# Patient Record
Sex: Female | Born: 1995 | Race: Black or African American | Hispanic: No | Marital: Single | Smoking: Current every day smoker
Health system: Southern US, Community
[De-identification: ages and names within clinical notes are randomized; demographics above are authoritative.]

## PROBLEM LIST (undated history)

## (undated) DIAGNOSIS — J45909 Unspecified asthma, uncomplicated: Secondary | ICD-10-CM

## (undated) DIAGNOSIS — T7840XA Allergy, unspecified, initial encounter: Secondary | ICD-10-CM

## (undated) HISTORY — PX: KNEE SURGERY: SHX244

---

## 2014-04-13 ENCOUNTER — Encounter (HOSPITAL_COMMUNITY): Payer: Self-pay | Admitting: Emergency Medicine

## 2014-04-13 ENCOUNTER — Emergency Department (HOSPITAL_COMMUNITY)
Admission: EM | Admit: 2014-04-13 | Discharge: 2014-04-13 | Disposition: A | Discharge status: Home / Self Care | Attending: Emergency Medicine | Admitting: Emergency Medicine

## 2014-04-13 ENCOUNTER — Emergency Department (HOSPITAL_COMMUNITY)

## 2014-04-13 DIAGNOSIS — R079 Chest pain, unspecified: Secondary | ICD-10-CM | POA: Insufficient documentation

## 2014-04-13 DIAGNOSIS — N390 Urinary tract infection, site not specified: Secondary | ICD-10-CM | POA: Diagnosis not present

## 2014-04-13 DIAGNOSIS — Z3202 Encounter for pregnancy test, result negative: Secondary | ICD-10-CM | POA: Insufficient documentation

## 2014-04-13 DIAGNOSIS — R51 Headache: Secondary | ICD-10-CM | POA: Insufficient documentation

## 2014-04-13 DIAGNOSIS — J45901 Unspecified asthma with (acute) exacerbation: Secondary | ICD-10-CM | POA: Insufficient documentation

## 2014-04-13 DIAGNOSIS — J4521 Mild intermittent asthma with (acute) exacerbation: Secondary | ICD-10-CM

## 2014-04-13 HISTORY — DX: Allergy, unspecified, initial encounter: T78.40XA

## 2014-04-13 LAB — CBC WITH DIFFERENTIAL/PLATELET
BASOS ABS: 0 10*3/uL (ref 0.0–0.1)
BASOS PCT: 0 % (ref 0–1)
Eosinophils Absolute: 0.3 10*3/uL (ref 0.0–0.7)
Eosinophils Relative: 3 % (ref 0–5)
HEMATOCRIT: 40.3 % (ref 36.0–46.0)
HEMOGLOBIN: 13.8 g/dL (ref 12.0–15.0)
LYMPHS ABS: 2.8 10*3/uL (ref 0.7–4.0)
Lymphocytes Relative: 33 % (ref 12–46)
MCH: 23.9 pg — ABNORMAL LOW (ref 26.0–34.0)
MCHC: 34.2 g/dL (ref 30.0–36.0)
MCV: 69.7 fL — AB (ref 78.0–100.0)
Monocytes Absolute: 0.5 10*3/uL (ref 0.1–1.0)
Monocytes Relative: 6 % (ref 3–12)
Neutro Abs: 5 10*3/uL (ref 1.7–7.7)
Neutrophils Relative %: 58 % (ref 43–77)
Platelets: 379 10*3/uL (ref 150–400)
RBC: 5.78 MIL/uL — ABNORMAL HIGH (ref 3.87–5.11)
RDW: 13.2 % (ref 11.5–15.5)
WBC: 8.6 10*3/uL (ref 4.0–10.5)

## 2014-04-13 LAB — URINALYSIS, ROUTINE W REFLEX MICROSCOPIC
BILIRUBIN URINE: NEGATIVE
GLUCOSE, UA: NEGATIVE mg/dL
Hgb urine dipstick: NEGATIVE
Ketones, ur: NEGATIVE mg/dL
Nitrite: POSITIVE — AB
Protein, ur: NEGATIVE mg/dL
SPECIFIC GRAVITY, URINE: 1.024 (ref 1.005–1.030)
UROBILINOGEN UA: 1 mg/dL (ref 0.0–1.0)
pH: 6 (ref 5.0–8.0)

## 2014-04-13 LAB — I-STAT CHEM 8, ED
BUN: 6 mg/dL (ref 6–23)
CALCIUM ION: 1.22 mmol/L (ref 1.12–1.23)
Chloride: 102 mEq/L (ref 96–112)
Creatinine, Ser: 0.8 mg/dL (ref 0.50–1.10)
Glucose, Bld: 108 mg/dL — ABNORMAL HIGH (ref 70–99)
HEMATOCRIT: 47 % — AB (ref 36.0–46.0)
Hemoglobin: 16 g/dL — ABNORMAL HIGH (ref 12.0–15.0)
Potassium: 3.5 mEq/L — ABNORMAL LOW (ref 3.7–5.3)
Sodium: 140 mEq/L (ref 137–147)
TCO2: 22 mmol/L (ref 0–100)

## 2014-04-13 LAB — D-DIMER, QUANTITATIVE: D-Dimer, Quant: <0.27 ug/mL-FEU (ref 0.00–0.48)

## 2014-04-13 LAB — I-STAT TROPONIN, ED: Troponin i, poc: 0 ng/mL (ref 0.00–0.08)

## 2014-04-13 LAB — URINE MICROSCOPIC-ADD ON

## 2014-04-13 LAB — POC URINE PREG, ED: PREG TEST UR: NEGATIVE

## 2014-04-13 MED ORDER — SODIUM CHLORIDE 0.9 % IV BOLUS (SEPSIS)
1000.0000 mL | Freq: Once | INTRAVENOUS | Status: AC
  Administered 2014-04-13: 1000 mL via INTRAVENOUS

## 2014-04-13 MED ORDER — NITROFURANTOIN MONOHYD MACRO 100 MG PO CAPS
100.0000 mg | ORAL_CAPSULE | Freq: Once | ORAL | Status: AC
  Administered 2014-04-13: 100 mg via ORAL
  Filled 2014-04-13: qty 1

## 2014-04-13 MED ORDER — ALBUTEROL SULFATE HFA 108 (90 BASE) MCG/ACT IN AERS
2.0000 | INHALATION_SPRAY | RESPIRATORY_TRACT | Status: DC | PRN
  Administered 2014-04-13: 2 via RESPIRATORY_TRACT
  Filled 2014-04-13: qty 6.7

## 2014-04-13 MED ORDER — NITROFURANTOIN MONOHYD MACRO 100 MG PO CAPS: 100.0000 mg | ORAL_CAPSULE | Freq: Two times a day (BID) | ORAL | Status: DC

## 2014-04-13 MED ORDER — PREDNISONE 20 MG PO TABS: ORAL_TABLET | ORAL | Status: DC

## 2014-04-13 MED ORDER — ALBUTEROL SULFATE (2.5 MG/3ML) 0.083% IN NEBU
5.0000 mg | INHALATION_SOLUTION | Freq: Once | RESPIRATORY_TRACT | Status: AC
  Administered 2014-04-13: 5 mg via RESPIRATORY_TRACT
  Filled 2014-04-13: qty 6

## 2014-04-13 MED ORDER — PREDNISONE 20 MG PO TABS
60.0000 mg | ORAL_TABLET | Freq: Once | ORAL | Status: AC
  Administered 2014-04-13: 60 mg via ORAL
  Filled 2014-04-13: qty 3

## 2014-04-13 MED ORDER — IPRATROPIUM BROMIDE 0.02 % IN SOLN
0.5000 mg | Freq: Once | RESPIRATORY_TRACT | Status: AC
  Administered 2014-04-13: 0.5 mg via RESPIRATORY_TRACT
  Filled 2014-04-13: qty 2.5

## 2014-04-13 NOTE — Discharge Instructions (Signed)
Bronchospasm °A bronchospasm is a spasm or tightening of the airways going into the lungs. During a bronchospasm breathing becomes more difficult because the airways get smaller. When this happens there can be coughing, a whistling sound when breathing (wheezing), and difficulty breathing. Bronchospasm is often associated with asthma, but not all patients who experience a bronchospasm have asthma. °CAUSES  °A bronchospasm is caused by inflammation or irritation of the airways. The inflammation or irritation may be triggered by:  °· Allergies (such as to animals, pollen, food, or mold). Allergens that cause bronchospasm may cause wheezing immediately after exposure or many hours later.   °· Infection. Viral infections are believed to be the most common cause of bronchospasm.   °· Exercise.   °· Irritants (such as pollution, cigarette smoke, strong odors, aerosol sprays, and paint fumes).   °· Weather changes. Winds increase molds and pollens in the air. Rain refreshes the air by washing irritants out. Cold air may cause inflammation.   °· Stress and emotional upset.   °SIGNS AND SYMPTOMS  °· Wheezing.   °· Excessive nighttime coughing.   °· Frequent or severe coughing with a simple cold.   °· Chest tightness.   °· Shortness of breath.   °DIAGNOSIS  °Bronchospasm is usually diagnosed through a history and physical exam. Tests, such as chest X-rays, are sometimes done to look for other conditions. °TREATMENT  °· Inhaled medicines can be given to open up your airways and help you breathe. The medicines can be given using either an inhaler or a nebulizer machine. °· Corticosteroid medicines may be given for severe bronchospasm, usually when it is associated with asthma. °HOME CARE INSTRUCTIONS  °· Always have a plan prepared for seeking medical care. Know when to call your health care provider and local emergency services (911 in the U.S.). Know where you can access local emergency care. °· Only take medicines as  directed by your health care provider. °· If you were prescribed an inhaler or nebulizer machine, ask your health care provider to explain how to use it correctly. Always use a spacer with your inhaler if you were given one. °· It is necessary to remain calm during an attack. Try to relax and breathe more slowly.  °· Control your home environment in the following ways:   °¨ Change your heating and air conditioning filter at least once a month.   °¨ Limit your use of fireplaces and wood stoves. °¨ Do not smoke and do not allow smoking in your home.   °¨ Avoid exposure to perfumes and fragrances.   °¨ Get rid of pests (such as roaches and mice) and their droppings.   °¨ Throw away plants if you see mold on them.   °¨ Keep your house clean and dust free.   °¨ Replace carpet with wood, tile, or vinyl flooring. Carpet can trap dander and dust.   °¨ Use allergy-proof pillows, mattress covers, and box spring covers.   °¨ Wash bed sheets and blankets every week in hot water and dry them in a dryer.   °¨ Use blankets that are made of polyester or cotton.   °¨ Wash hands frequently. °SEEK MEDICAL CARE IF:  °· You have muscle aches.   °· You have chest pain.   °· The sputum changes from clear or white to yellow, green, gray, or bloody.   °· The sputum you cough up gets thicker.   °· There are problems that may be related to the medicine you are given, such as a rash, itching, swelling, or trouble breathing.   °SEEK IMMEDIATE MEDICAL CARE IF:  °· You have worsening wheezing and coughing even   after taking your prescribed medicines.   You have increased difficulty breathing.   You develop severe chest pain. MAKE SURE YOU:   Understand these instructions.  Will watch your condition.  Will get help right away if you are not doing well or get worse. Document Released: 08/13/2003 Document Revised: 08/15/2013 Document Reviewed: 01/30/2013 [facility] Patient Information 2015 [place], [place]. This information is not  intended to replace advice given to you by your health care provider. Make sure you discuss any questions you have with your health care provider.  Urinary Tract Infection A urinary tract infection (UTI) can occur any place along the urinary tract. The tract includes the kidneys, ureters, bladder, and urethra. A type of germ called bacteria often causes a UTI. UTIs are often helped with antibiotic medicine.  HOME CARE   If given, take antibiotics as told by your doctor. Finish them even if you start to feel better.  Drink enough fluids to keep your pee (urine) clear or pale yellow.  Avoid tea, drinks with caffeine, and bubbly (carbonated) drinks.  Pee often. Avoid holding your pee in for a long time.  Pee before and after having sex (intercourse).  Wipe from front to back after you poop (bowel movement) if you are a woman. Use each tissue only once. GET HELP RIGHT AWAY IF:   You have back pain.  You have lower belly (abdominal) pain.  You have chills.  You feel sick to your stomach (nauseous).  You throw up (vomit).  Your burning or discomfort with peeing does not go away.  You have a fever.  Your symptoms are not better in 3 days. MAKE SURE YOU:   Understand these instructions.  Will watch your condition.  Will get help right away if you are not doing well or get worse. Document Released: 01/27/2008 Document Revised: 05/04/2012 Document Reviewed: 03/10/2012 [facility] Patient Information 2015 [place], [place]. This information is not intended to replace advice given to you by your health care provider. Make sure you discuss any questions you have with your health care provider.   Emergency Department Resource Guide 1) Find a Doctor and Pay Out of Pocket Although you won't have to find out who is covered by your insurance plan, it is a good idea to ask around and get recommendations. You will then need to call the office and see if the doctor you have chosen will accept  you as a new patient and what types of options they offer for patients who are self-pay. Some doctors offer discounts or will set up payment plans for their patients who do not have insurance, but you will need to ask so you aren't surprised when you get to your appointment.  2) Contact Your Local Health Department Not all health departments have doctors that can see patients for sick visits, but many do, so it is worth a call to see if yours does. If you don't know where your local health department is, you can check in your phone book. The CDC also has a tool to help you locate your state's health department, and many state websites also have listings of all of their local health departments.  3) Find a Walk-in Clinic If your illness is not likely to be very severe or complicated, you may want to try a walk in clinic. These are popping up all over the country in pharmacies, drugstores, and shopping centers. They're usually staffed by nurse practitioners or physician assistants that have been trained to treat common  illnesses and complaints. They're usually fairly quick and inexpensive. However, if you have serious medical issues or chronic medical problems, these are probably not your best option.  No Primary Care Doctor: - Call Health Connect at  [phone] - they can help you locate a primary care doctor that  accepts your insurance, provides certain services, etc. - Physician Referral Service- [phone]  Chronic Pain Problems: Organization         Address  Phone   Notes  [facility]  [phone] Patients need to be referred by their primary care doctor.   Medication Assistance: Organization         Address  Phone   Notes  [facility] Medication [facility] [address] [place] [place], [place] [address] [phone] --Must be a resident of [facility] -- Must have NO insurance coverage whatsoever (no Medicaid/ Medicare, etc.) -- The pt. MUST  have a primary care doctor that directs their care regularly and follows them in the community   [facility]ist  [phone]   [employer]  [phone]    Agencies that provide inexpensive medical care: Organization         Address  Phone   Notes  [facility]  [phone]   [facility]    [phone]   [facility] [address] [place], [place] [address] [phone]   [facility] [address] [place] [place] [phone]   Planned Parenthood    [phone]   [facility]    [phone]   Community [facility] and [facility]  [address], [place] Phone:  [phone], Fax:  [phone] Hours of Operation:  9 am - 6 pm, M-F.  Also accepts Medicaid/Medicare and self-pay.  [facility]  [address], [place] Phone: [phone], Fax: [phone]. Hours of Operation:  8:30 am - 5:30 pm, M-F.  Also accepts Medicaid and self-pay.  [facility] [place] [address], [place] Phone: [phone]   [facility] [address] Natasha Bence [place], [place] [phone], Ext. [phone] Mondays & Thursdays: 7-9 AM.  First 15 patients are seen on a first come, first serve basis.    Medicaid-accepting [facility] Providers:  Organization         Address  Phone   Notes  [facility] [address],  [phone] Also accepts self-pay patients.  [facility] [address] Laurell Josephs [place], [place]  [phone]   [facility] [address], [place] [phone]   [facility] [address] [place] [phone]   Renaye Rakers [address], [place]   [phone] Only accepts [place] patients after they have their name applied to their card.   Self-Pay (no insurance) in [facility]:  Organization         Address  Phone   Notes  Sickle Cell Patients, [facility] [address] [place], [place] [phone]   [facility] [address] [place], [place] [phone]   [facility] [place]  [address], [place] [phone]   [facility]/Dr. Osei-Bonsu  [address], [place] or [address], Laurell Josephs [address],  [place] [phone] Phone number for both [facility] and [place] locations is the same.  [facility]gent [facility] and [facility] [address], [place] [phone]   [facility]  [address] [place] [address], [place] or [address] [phone]   [facility] [address], [place] [phone]) [phone], phone; [phone], fax Sees patients 1st and 3rd Saturday of every month.  Must not qualify for public or private insurance (i.e. Medicaid, Medicare, [place] Health Choice, Veterans' Benefits)  Household income should be no more than 200% of the poverty level The clinic cannot treat you if you are pregnant or think you are pregnant  Sexually transmitted diseases are not treated at the clinic.    Dental Care: Organization         Address  Phone  Notes  [facility] [address], [place] [phone] Accepts children up to age 20 who are enrolled in [place]icaid or [place] Health Choice; pregnant women with a Medicaid card; and children who have applied for Medicaid or [place] Health Choice, but were declined, whose parents can pay a reduced fee at time of service.  [facility] [place]  [address], [place] [phone]) [phone] Accepts children up to age 20 who are enrolled in [place] or [place] Health Choice; pregnant women with a Medicaid card; and children who have applied for Medicaid or [place] Health Choice, but were declined, whose parents can  pay a reduced fee at time of service.  [facility]  [address], [place] [phone] Patients are seen by appointment only. Walk-ins are not accepted. [facility] will see patients 20 years of age and older. Monday - Tuesday (8am-5pm) Most Wednesdays (8:30-5pm) $30 per visit, cash only  [facility]  [address], [facility] [phone] Patients are seen by appointment only. Walk-ins are not accepted. [facility] will see patients 20 years of age and older. One Wednesday Evening (Monthly: Volunteer Based).  $30 per visit, cash only  [employer] [facility]  [phone] for adults; Children under age 474, call Graduate Pediatric Dentistry at [phone]. Children aged 894-14, please call [phone] to request a pediatric application.  Dental services are provided in all areas of dental care including fillings, crowns and bridges, complete and partial dentures, implants, gum treatment, root canals, and extractions. Preventive care is also provided. Treatment is provided to both adults and children. Patients are selected via a lottery and there is often a waiting list.   [facility] [address], [place]  [phone] [email or web address]   [facility] [address], [place], [place] [phone], Ext. [phone] Second and Fourth Thursday of each month, opens at 6:30 AM; Clinic ends at 9 AM.  Patients are seen on a first-come first-served basis, and a limited number are seen during each clinic.   [facility]  [address], [place], [place] [phone]) [phone]   Eligibility Requirements You must have lived in [place], [place], or [place] for at least the last three months.   You cannot be eligible for state or federal sponsored National Cityhealthcare insurance, including CIGNAVeterans Administration, [place]icaid, or [employer]Medicare.   You generally cannot be eligible for healthcare  insurance through your employer.    How to apply: Eligibility screenings are held every Tuesday and  Wednesday afternoon from 1:00 pm until 4:00 pm. You do not need an appointment for the interview!  [facility] [address], [place], [place] [phone]   [facility]  [phone]   [facility]  [phone]   [facility]  [phone]    Behavioral Health Resources in the Community: Intensive Outpatient Programs Organization         Address  Phone  Notes  [facility] [address], [place], [place] [phone]   [facility] [address], [place], [place] [phone]   [facility] [address], [place], [place]  [phone]   [facility] [place] [facility] [address],  [place], [place] [phone] or [phone]   Substance Abuse Resources Organization         Address  Phone  Notes  [facility]  [phone]   [facility]  [phone]   [facility]  [phone]   [facility]  [phone]   Residential & Outpatient Substance Abuse Program  [phone]   Psychological Services Organization         Address  Phone  Notes  [facility]  [phone]- [phone]   [facility]  [phone]- [phone]   [facility] [address], [place] [phone] or [phone]    Mobile Crisis Teams Organization         Address  Phone  Notes  [facility], Mobile Crisis Care Unit  [phone]   [facility]  [address] [place], [place] [phone]   Doristine LocksSharon DeEsch [address] [place] [phone]    Self-Help/Support Groups Organization         Address  Phone             Notes  [facility] [place] - variety of support groups  336- [phone] Call for more information  Narcotics Anonymous (NA),  [facility] [address], [place]  2 meetings at this location   Statisticianesidential Treatment Programs Organization         Address  Phone  Notes  [facility] [address],    [place]  [phone]   [facility]  [address], [place] [phone], [place], [place] [phone]   [facility] [address], [place] [phone] Admissions: 8am-3pm M-F  [facility] [address]    [place], [place] [phone]   [facility] [address], [place], [place] [phone]   [facility] [address]  [place], [place] [phone]   [facility] - Intensive Outpatient [address], [place], [place] [phone]   [facility] ([facility] Recovery [facility] [address]  [place], [place] [phone] or [phone]   [facility] ([facility]) [address] [place], [place] [phone] Accepts Medicaid  [facility] [address]  [place] [phone] Substance Abuse/Addiction Treatment   [facility] Resources Organization         Address  Phone  Notes  [facility]  [phone]   Angie FavaJulie Brannon, PhD [address], [address] [place], [place]   [phone] or [phone](336) [phone]   [facility]   [address] [place], [place] [phone](336) [phone]   [facility] [address], [place], [place] [phone]) [phone] Insurance/Medicaid/sponsorship through [facility]  [facility]  [facility]amilies [address]                                    [place], [place] [phone] Therapy/tele-psych/case  [facility] [address]   [place], [place] [phone]    Dr. Lolly Mustache  [phone]   [facility]  [employer] [facility] Dept. 1) [address], [place] 2) [address] [place] 3)  [address], [place] [phone]  [phone]     [facility] Child Abuse Hotline 910-128-1903 or [phone] (After Hours)

## 2014-04-13 NOTE — ED Notes (Signed)
Pt ambulated around ED. Pulse Ox 100%. HR 130. Pt denies distress. Pt tolerated well.  PA aware.

## 2014-04-13 NOTE — ED Notes (Signed)
Per pt, has had chest pain starting this morning. Pain in center of chest and sharp. Has been short of breath for 2 days.  No hx of asthma diagnosed but has had problems in the past.   .

## 2014-04-13 NOTE — ED Notes (Signed)
PA Tran at bedside. 

## 2014-04-13 NOTE — ED Provider Notes (Signed)
Medical screening examination/treatment/procedure(s) were performed by non-physician practitioner and as supervising physician I was immediately available for consultation/collaboration.   EKG Interpretation   Date/Time:  Friday April 13 2014 17:10:38 EDT Ventricular Rate:  131 PR Interval:  158 QRS Duration: 57 QT Interval:  267 QTC Calculation: 394 R Axis:   87 Text Interpretation:  Sinus tachycardia Consider right atrial enlargement  Nonspecific T abnormalities, diffuse leads Artifact in lead(s) V4 V5 No  previous ECGs available Confirmed by Ronon Ferger  MD, Christeen Lai ([id number]) on 04/13/2014  5:30:46 PM        Richardean Canalavid H Genora Arp, MD 04/13/14 2340

## 2014-04-13 NOTE — ED Provider Notes (Signed)
CSN: [id number]     Arrival date & time 04/13/14  1701 History  This chart was scribed for Carol Helper, PA, working with Richardean Canal, MD found by Elon Spanner, ED Scribe. This patient was seen in room WTR5/WTR5 and the patient's care was started at 5:13 PM.   No chief complaint on file.  The history is provided by the patient. No language interpreter was used.    HPI Comments: Carol Ramos is a 18 y.o. female who presents to the Emergency Department complaining of gradually worsening SOB onset 1 week ago with worsening today.  Patient states she has a history of undiagnosed asthma and similar episodes of SOB in the past.  Patient also complains of associated CP onset today, which she states is atypical of her episodes of SOB. She reports one episode of hemoptysis today as well as current nausea, rhinorrhea, and headache.   She states her chest pain is aggravated by a deep breath.  Patient states she flew on an airplane on 8/14.  Patient denies fever, chills, sore throat, vomiting, diarrhea, DVT's, recent surgery, birth control use, pregnancy.  No past medical history on file. No past surgical history on file. No family history on file. History  Substance Use Topics  . Smoking status: Not on file  . Smokeless tobacco: Not on file  . Alcohol Use: Not on file   OB History   No data available     Review of Systems  Constitutional: Negative for fever and chills.  HENT: Positive for rhinorrhea. Negative for sore throat.   Respiratory: Positive for cough and shortness of breath.   Gastrointestinal: Positive for nausea. Negative for vomiting.  Neurological: Positive for headaches.      Allergies  Review of patient's allergies indicates not on file.  Home Medications   Prior to Admission medications   Not on File   There were no vitals taken for this visit. Physical Exam  Nursing note and vitals reviewed. Constitutional: She is oriented to person, place, and time. She appears  well-developed and well-nourished. No distress.  HENT:  Head: Normocephalic and atraumatic.  ENT normal   Eyes: Conjunctivae and EOM are normal.  Neck: Neck supple. No tracheal deviation present.  Cardiovascular:  Tachycardic.    Pulmonary/Chest: She has wheezes. She has no rales. She exhibits tenderness.  Decreased breath sounds with expiratory wheezes throughout.  No rales.  Anterior chest wall tenderness to palpation.  No crepitus or erythema.    Musculoskeletal: Normal range of motion.  Neurological: She is alert and oriented to person, place, and time.  Skin: Skin is warm and dry.  Psychiatric: She has a normal mood and affect. Her behavior is normal.    ED Course  Procedures (including critical care time)  DIAGNOSTIC STUDIES: Oxygen Saturation is 100% on RA, normal by my interpretation.    COORDINATION OF CARE:  5:21 PM Likely asthma exacerbation.  However pt has some risk factors for PE including hemoptysis, recent plane travel from [place] to [facility].  Discussed plan to order breathing treatment and D-dimer.  Patient acknowledges and agrees with plan.  Care discussed with Dr. Silverio Lay.   6:28 PM Pt also endorse urinary urgency/frequency x 1-2 weeks.  NO abd or back pain.  Will check UA.  7:52 PM CXR without acute infiltrates concerning for PNA.  Pt felt much better after breathing treatment, lungs clear on reexamination, ambulate with 100% on RA.  D-dimer negative, low suspicion for PE.  Tachycardia improves with  IVF.  No fever.  Pt nontoxic in appearance.  UA with evidence of UTI.  macrobid provided.  Pt will be discharge with rescue inhaler, steroid, and abx.  Resources provided.  All questions answered to pts satisfaction.    Labs Review Labs Reviewed  CBC WITH DIFFERENTIAL - Abnormal; Notable for the following:    RBC 5.78 (*)    MCV 69.7 (*)    MCH 23.9 (*)    All other components within normal limits  URINALYSIS, ROUTINE W REFLEX MICROSCOPIC - Abnormal; Notable for the  following:    Color, Urine AMBER (*)    APPearance CLOUDY (*)    Nitrite POSITIVE (*)    Leukocytes, UA TRACE (*)    All other components within normal limits  URINE MICROSCOPIC-ADD ON - Abnormal; Notable for the following:    Bacteria, UA MANY (*)    All other components within normal limits  I-STAT CHEM 8, ED - Abnormal; Notable for the following:    Potassium 3.5 (*)    Glucose, Bld 108 (*)    Hemoglobin 16.0 (*)    HCT 47.0 (*)    All other components within normal limits  D-DIMER, QUANTITATIVE  POC URINE PREG, ED  Rosezena SensorI-STAT TROPOININ, ED    Imaging Review Dg Chest 2 View  04/13/2014   CLINICAL DATA:  Shortness of breath, wheezing  EXAM: CHEST  2 VIEW  COMPARISON:  None.  FINDINGS: Cardiomediastinal silhouette is unremarkable. No acute infiltrate or pleural effusion. No pulmonary edema. Central mild bronchitic changes. Bony thorax is unremarkable.  IMPRESSION: No acute infiltrate or pulmonary edema. Central mild bronchitic changes.   Electronically Signed   By: Natasha MeadLiviu  Pop M.D.   On: 04/13/2014 17:42     EKG Interpretation   Date/Time:  Friday April 13 2014 17:10:38 EDT Ventricular Rate:  131 PR Interval:  158 QRS Duration: 57 QT Interval:  267 QTC Calculation: 394 R Axis:   87 Text Interpretation:  Sinus tachycardia Consider right atrial enlargement  Nonspecific T abnormalities, diffuse leads Artifact in lead(s) V4 V5 No  previous ECGs available Confirmed by YAO  MD, DAVID ([id number]) on 04/13/2014  5:30:46 PM      MDM   Final diagnoses:  Asthma exacerbation attacks, mild intermittent  UTI (lower urinary tract infection)    BP 107/67  Pulse 116  Resp 22  SpO2 100%  LMP 03/10/2014  I have reviewed nursing notes and vital signs. I personally reviewed the imaging tests through PACS system  I reviewed available ER/hospitalization records thought the EMR  I personally performed the services described in this documentation, which was scribed in my presence. The  recorded information has been reviewed and is accurate.     Carol HelperBowie Mika Anastasi, PA-C 04/13/14 1955

## 2014-04-22 ENCOUNTER — Emergency Department (HOSPITAL_COMMUNITY)

## 2014-04-22 ENCOUNTER — Encounter (HOSPITAL_COMMUNITY): Payer: Self-pay | Admitting: Emergency Medicine

## 2014-04-22 ENCOUNTER — Emergency Department (HOSPITAL_COMMUNITY)
Admission: EM | Admit: 2014-04-22 | Discharge: 2014-04-23 | Disposition: A | Discharge status: Home / Self Care | Attending: Emergency Medicine | Admitting: Emergency Medicine

## 2014-04-22 DIAGNOSIS — Z792 Long term (current) use of antibiotics: Secondary | ICD-10-CM | POA: Insufficient documentation

## 2014-04-22 DIAGNOSIS — R071 Chest pain on breathing: Secondary | ICD-10-CM | POA: Insufficient documentation

## 2014-04-22 DIAGNOSIS — J45901 Unspecified asthma with (acute) exacerbation: Secondary | ICD-10-CM | POA: Insufficient documentation

## 2014-04-22 DIAGNOSIS — Z79899 Other long term (current) drug therapy: Secondary | ICD-10-CM | POA: Diagnosis not present

## 2014-04-22 DIAGNOSIS — R079 Chest pain, unspecified: Secondary | ICD-10-CM | POA: Insufficient documentation

## 2014-04-22 DIAGNOSIS — F17200 Nicotine dependence, unspecified, uncomplicated: Secondary | ICD-10-CM | POA: Diagnosis not present

## 2014-04-22 DIAGNOSIS — Z3202 Encounter for pregnancy test, result negative: Secondary | ICD-10-CM | POA: Diagnosis not present

## 2014-04-22 DIAGNOSIS — R0789 Other chest pain: Secondary | ICD-10-CM

## 2014-04-22 HISTORY — DX: Unspecified asthma, uncomplicated: J45.909

## 2014-04-22 LAB — CBC
HEMATOCRIT: 37.5 % (ref 36.0–46.0)
Hemoglobin: 13.1 g/dL (ref 12.0–15.0)
MCH: 24.2 pg — ABNORMAL LOW (ref 26.0–34.0)
MCHC: 34.9 g/dL (ref 30.0–36.0)
MCV: 69.2 fL — ABNORMAL LOW (ref 78.0–100.0)
Platelets: 362 10*3/uL (ref 150–400)
RBC: 5.42 MIL/uL — ABNORMAL HIGH (ref 3.87–5.11)
RDW: 13.2 % (ref 11.5–15.5)
WBC: 10.7 10*3/uL — ABNORMAL HIGH (ref 4.0–10.5)

## 2014-04-22 LAB — I-STAT TROPONIN, ED: Troponin i, poc: 0 ng/mL (ref 0.00–0.08)

## 2014-04-22 MED ORDER — ASPIRIN 325 MG PO TABS
325.0000 mg | ORAL_TABLET | ORAL | Status: AC
  Administered 2014-04-22: 325 mg via ORAL
  Filled 2014-04-22: qty 1

## 2014-04-22 NOTE — ED Notes (Signed)
Patient stated she was at home watching a movie and all of sudden her chest starting hurting.

## 2014-04-22 NOTE — ED Notes (Signed)
Pt arrived with family c/o severe L chest pain with SHOB onset just before arrival. Pt tearful, writhing in chair.

## 2014-04-23 LAB — RAPID URINE DRUG SCREEN, HOSP PERFORMED
AMPHETAMINES: NOT DETECTED
BARBITURATES: NOT DETECTED
Benzodiazepines: NOT DETECTED
COCAINE: NOT DETECTED
Opiates: NOT DETECTED
TETRAHYDROCANNABINOL: NOT DETECTED

## 2014-04-23 LAB — COMPREHENSIVE METABOLIC PANEL
ALBUMIN: 4 g/dL (ref 3.5–5.2)
ALK PHOS: 76 U/L (ref 39–117)
ALT: 8 U/L (ref 0–35)
AST: 12 U/L (ref 0–37)
Anion gap: 14 (ref 5–15)
BUN: 15 mg/dL (ref 6–23)
CHLORIDE: 102 meq/L (ref 96–112)
CO2: 22 mEq/L (ref 19–32)
Calcium: 9.9 mg/dL (ref 8.4–10.5)
Creatinine, Ser: 0.77 mg/dL (ref 0.50–1.10)
GFR calc Af Amer: >90 mL/min (ref >90)
GFR calc non Af Amer: >90 mL/min (ref >90)
Glucose, Bld: 75 mg/dL (ref 70–99)
POTASSIUM: 3.6 meq/L — AB (ref 3.7–5.3)
SODIUM: 138 meq/L (ref 137–147)
Total Bilirubin: 0.3 mg/dL (ref 0.3–1.2)
Total Protein: 7.6 g/dL (ref 6.0–8.3)

## 2014-04-23 LAB — D-DIMER, QUANTITATIVE: D-Dimer, Quant: 0.34 ug/mL-FEU (ref 0.00–0.48)

## 2014-04-23 LAB — PREGNANCY, URINE: Preg Test, Ur: NEGATIVE

## 2014-04-23 MED ORDER — IBUPROFEN 600 MG PO TABS: 600.0000 mg | ORAL_TABLET | Freq: Four times a day (QID) | ORAL | Status: DC | PRN

## 2014-04-23 MED ORDER — KETOROLAC TROMETHAMINE 30 MG/ML IJ SOLN
30.0000 mg | Freq: Once | INTRAMUSCULAR | Status: AC
  Administered 2014-04-23: 30 mg via INTRAMUSCULAR
  Filled 2014-04-23: qty 1

## 2014-04-23 NOTE — ED Notes (Signed)
Per Tresa Endo in the lab, a blue top was previously drawn and the D-dimer, quantitative can be run off of that.

## 2014-04-23 NOTE — ED Provider Notes (Signed)
CSN: [id number]     Arrival date & time 04/22/14  2317 History   First MD Initiated Contact with Patient 04/23/14 0006     Chief Complaint  Patient presents with  . Chest Pain     (Consider location/radiation/quality/duration/timing/severity/associated sxs/prior Treatment) HPI Comments: Is a 18 year old female with recurrent chest pain.  Does have a history of, asthma, states she didn't need to use her inhaler earlier today, but does not feel she needs to use it.  At this time.  She was seen in the emergency department.  On August 20 with similar presentation.  This was after a long plane flight.  Her d-dimer was negative at that point.  She was fine from that point forward until today when she again developed generalized.  Left chest pain with shortness of breath which is exacerbated by deep breathing or movement.  She has not tried any home therapies taken any over-the-counter medication for symptom. Denies any new recent travel, trauma, fever, URI, symptoms, nausea, vomiting  Patient is a 18 y.o. female presenting with chest pain. The history is provided by the patient.  Chest Pain Pain location:  Unable to specify Pain quality: aching   Pain radiates to:  Does not radiate Pain radiates to the back: no   Pain severity:  Severe Onset quality:  Gradual Duration:  2 hours Timing:  Constant Progression:  Unchanged Chronicity:  Recurrent Context: breathing, movement and at rest   Context: not lifting   Relieved by:  None tried Worsened by:  Deep breathing and movement Ineffective treatments:  None tried Associated symptoms: shortness of breath   Associated symptoms: no anxiety, no back pain, no cough, no fever and no heartburn   Risk factors: no birth control, no immobilization, not female, not obese and no smoking     Past Medical History  Diagnosis Date  . Allergy   . Asthma    Past Surgical History  Procedure Laterality Date  . Knee surgery     No family history on  file. History  Substance Use Topics  . Smoking status: Current Some Day Smoker  . Smokeless tobacco: Not on file  . Alcohol Use: Yes     Comment: social   OB History   Grav Para Term Preterm Abortions TAB SAB Ect Mult Living                 Review of Systems  Constitutional: Negative for fever.  Respiratory: Positive for shortness of breath. Negative for cough and wheezing.   Cardiovascular: Positive for chest pain.  Gastrointestinal: Negative for heartburn.  Musculoskeletal: Negative for back pain.  Skin: Negative for rash.  All other systems reviewed and are negative.     Allergies  Review of patient's allergies indicates no known allergies.  Home Medications   Prior to Admission medications   Medication Sig Start Date End Date Taking? Authorizing Provider  albuterol (PROVENTIL HFA;VENTOLIN HFA) 108 (90 BASE) MCG/ACT inhaler Inhale 2 puffs into the lungs every 4 (four) hours as needed for wheezing or shortness of breath.   Yes Historical Provider, MD  diphenhydrAMINE (BENADRYL) 25 MG tablet Take 50 mg by mouth See admin instructions. Every other night.   Yes Historical Provider, MD  nitrofurantoin, macrocrystal-monohydrate, (MACROBID) 100 MG capsule Take 1 capsule (100 mg total) by mouth 2 (two) times daily. 04/13/14  Yes [name], PA-C  predniSONE (DELTASONE) 20 MG tablet 3 tabs po day one, then 2 tabs daily x 4 days 04/13/14  Yes  [name], PA-C  ibuprofen (ADVIL,MOTRIN) 600 MG tablet Take 1 tablet (600 mg total) by mouth every 6 (six) hours as needed. 04/23/14   Arman Filter, NP   BP 101/66  Pulse 87  Temp(Src) 98.4 F (36.9 C) (Oral)  Resp 16  Ht  (1.626 m)  Wt 115 lb (52.164 kg)  BMI 19.73 kg/m2  SpO2 99%  LMP 03/10/2014 Physical Exam  Nursing note and vitals reviewed. Constitutional: She appears well-developed and well-nourished.  HENT:  Head: Normocephalic.  Eyes: Pupils are equal, round, and reactive to light.  Neck: Normal range of motion.   Cardiovascular: Normal rate and regular rhythm.   No murmur heard. Pulmonary/Chest: Effort normal and breath sounds normal. No respiratory distress. She has no wheezes. She exhibits tenderness.  Abdominal: Soft. She exhibits no distension. There is no tenderness.  Musculoskeletal: Normal range of motion.  Neurological: She is alert.  Skin: No rash noted. No erythema.    ED Course  Procedures (including critical care time) Labs Review Labs Reviewed  CBC - Abnormal; Notable for the following:    WBC 10.7 (*)    RBC 5.42 (*)    MCV 69.2 (*)    MCH 24.2 (*)    All other components within normal limits  COMPREHENSIVE METABOLIC PANEL - Abnormal; Notable for the following:    Potassium 3.6 (*)    All other components within normal limits  PREGNANCY, URINE  URINE RAPID DRUG SCREEN (HOSP PERFORMED)  D-DIMER, QUANTITATIVE  Rosezena Sensor, ED    Imaging Review Dg Chest Port 1 View  04/22/2014   CLINICAL DATA:  CHEST PAIN  EXAM: PORTABLE CHEST - 1 VIEW  COMPARISON:  04/13/2014  FINDINGS: Lungs are clear. Heart size and mediastinal contours are within normal limits. No effusion. Visualized skeletal structures are unremarkable.  IMPRESSION: No acute cardiopulmonary disease.   Electronically Signed   By: Oley Balm M.D.   On: 04/22/2014 23:50     EKG Interpretation   Date/Time:  Sunday April 22 2014 23:21:26 EDT Ventricular Rate:  107 PR Interval:  173 QRS Duration: 62 QT Interval:  317 QTC Calculation: 423 R Axis:   83 Text Interpretation:  Fast sinus arrhythmia Abnormal Q suggests anterior  infarct Baseline wander in lead(s) V4 V5 t wave inversions rsolved from  prior Confirmed by OTTER  MD, OLGA ([id number]) on 04/22/2014 11:24:07 PM      MDM   Final diagnoses:  Chest wall pain         Arman Filter, NP 04/23/14 7253

## 2014-04-23 NOTE — ED Provider Notes (Signed)
Medical screening examination/treatment/procedure(s) were performed by non-physician practitioner and as supervising physician I was immediately available for consultation/collaboration.   EKG Interpretation   Date/Time:  Sunday April 22 2014 23:21:26 EDT Ventricular Rate:  107 PR Interval:  173 QRS Duration: 62 QT Interval:  317 QTC Calculation: 423 R Axis:   83 Text Interpretation:  Fast sinus arrhythmia Abnormal Q suggests anterior  infarct Baseline wander in lead(s) V4 V5 t wave inversions rsolved from  prior Confirmed by Orli Degrave  MD, Vong Garringer ([id number]) on 04/22/2014 11:24:07 PM       Olivia Mackie, MD 04/23/14 [phone]

## 2014-04-23 NOTE — Discharge Instructions (Signed)
Chest Wall Pain Chest wall pain is pain in or around the bones and muscles of your chest. It may take up to 6 weeks to get better. It may take longer if you must stay physically active in your work and activities.  CAUSES  Chest wall pain may happen on its own. However, it may be caused by:  A viral illness like the flu.  Injury.  Coughing.  Exercise.  Arthritis.  Fibromyalgia.  Shingles. HOME CARE INSTRUCTIONS   Avoid overtiring physical activity. Try not to strain or perform activities that cause pain. This includes any activities using your chest or your abdominal and side muscles, especially if heavy weights are used.  Put ice on the sore area.  Put ice in a plastic bag.  Place a towel between your skin and the bag.  Leave the ice on for 15-20 minutes per hour while awake for the first 2 days.  Only take over-the-counter or prescription medicines for pain, discomfort, or fever as directed by your caregiver. SEEK IMMEDIATE MEDICAL CARE IF:   Your pain increases, or you are very uncomfortable.  You have a fever.  Your chest pain becomes worse.  You have new, unexplained symptoms.  You have nausea or vomiting.  You feel sweaty or lightheaded.  You have a cough with phlegm (sputum), or you cough up blood. MAKE SURE YOU:   Understand these instructions.  Will watch your condition.  Will get help right away if you are not doing well or get worse. Document Released: 08/10/2005 Document Revised: 11/02/2011 Document Reviewed: 04/06/2011 [facility] Patient Information 2015 [place], [place]. This information is not intended to replace advice given to you by your health care provider. Make sure you discuss any questions you have with your health care provider. Tonight your cardiac evaluation is normal your lungs are normal

## 2014-05-18 ENCOUNTER — Emergency Department (HOSPITAL_COMMUNITY)
Admission: EM | Admit: 2014-05-18 | Discharge: 2014-05-18 | Disposition: A | Discharge status: Home / Self Care | Attending: Emergency Medicine | Admitting: Emergency Medicine

## 2014-05-18 ENCOUNTER — Encounter (HOSPITAL_COMMUNITY): Payer: Self-pay | Admitting: Emergency Medicine

## 2014-05-18 DIAGNOSIS — IMO0002 Reserved for concepts with insufficient information to code with codable children: Secondary | ICD-10-CM | POA: Insufficient documentation

## 2014-05-18 DIAGNOSIS — F17200 Nicotine dependence, unspecified, uncomplicated: Secondary | ICD-10-CM | POA: Diagnosis not present

## 2014-05-18 DIAGNOSIS — J45901 Unspecified asthma with (acute) exacerbation: Secondary | ICD-10-CM | POA: Insufficient documentation

## 2014-05-18 DIAGNOSIS — J45909 Unspecified asthma, uncomplicated: Secondary | ICD-10-CM | POA: Insufficient documentation

## 2014-05-18 DIAGNOSIS — J9801 Acute bronchospasm: Secondary | ICD-10-CM

## 2014-05-18 DIAGNOSIS — Z79899 Other long term (current) drug therapy: Secondary | ICD-10-CM | POA: Insufficient documentation

## 2014-05-18 MED ORDER — ALBUTEROL SULFATE (2.5 MG/3ML) 0.083% IN NEBU
5.0000 mg | INHALATION_SOLUTION | Freq: Once | RESPIRATORY_TRACT | Status: AC
  Administered 2014-05-18: 5 mg via RESPIRATORY_TRACT
  Filled 2014-05-18: qty 6

## 2014-05-18 MED ORDER — ALBUTEROL SULFATE HFA 108 (90 BASE) MCG/ACT IN AERS
2.0000 | INHALATION_SPRAY | Freq: Once | RESPIRATORY_TRACT | Status: AC
  Administered 2014-05-18: 2 via RESPIRATORY_TRACT
  Filled 2014-05-18: qty 6.7

## 2014-05-18 MED ORDER — PREDNISONE 20 MG PO TABS
60.0000 mg | ORAL_TABLET | Freq: Once | ORAL | Status: AC
  Administered 2014-05-18: 60 mg via ORAL
  Filled 2014-05-18: qty 3

## 2014-05-18 MED ORDER — PREDNISONE 10 MG PO TABS: ORAL_TABLET | ORAL | Status: DC

## 2014-05-18 MED ORDER — IPRATROPIUM BROMIDE 0.02 % IN SOLN
0.5000 mg | Freq: Once | RESPIRATORY_TRACT | Status: AC
  Administered 2014-05-18: 0.5 mg via RESPIRATORY_TRACT
  Filled 2014-05-18: qty 2.5

## 2014-05-18 NOTE — ED Provider Notes (Signed)
Medical screening examination/treatment/procedure(s) were performed by non-physician practitioner and as supervising physician I was immediately available for consultation/collaboration.   EKG Interpretation None        Toy Cookey, MD 05/18/14 2042
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
## 2014-05-18 NOTE — ED Provider Notes (Signed)
CSN: [id number]     Arrival date & time 05/18/14  0055 History   First MD Initiated Contact with Patient 05/18/14 0100     Chief Complaint  Patient presents with  . Asthma     (Consider location/radiation/quality/duration/timing/severity/associated sxs/prior Treatment) HPI Carol Ramos is a 18 y.o. female who presents emergency department or shortness of breath. 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 Patient believes that smoked triggered it. She states she was coughing or wheezing. No prior cough. No fever or chills. She does report nasal congestion and watery and itchy eyes for the last several days. She did not take anything prior to coming in. She denies any chest pain. She does admit to smoking daily. She has no other medical problems.  Past Medical History  Diagnosis Date  . Allergy   . Asthma    Past Surgical History  Procedure Laterality Date  . Knee surgery     History reviewed. No pertinent family history. History  Substance Use Topics  . Smoking status: Current Some Day Smoker  . Smokeless tobacco: Not on file  . Alcohol Use: Yes     Comment: social   OB History   Grav Para Term Preterm Abortions TAB SAB Ect Mult Living                 Review of Systems  Constitutional: Negative for fever and chills.  Respiratory: Positive for cough, shortness of breath and wheezing. Negative for chest tightness.   Cardiovascular: Negative for chest pain, palpitations and leg swelling.  Gastrointestinal: Negative for nausea, vomiting, abdominal pain and diarrhea.  Musculoskeletal: Negative for arthralgias, myalgias, neck pain and neck stiffness.  Skin: Negative for rash.  Neurological: Negative for dizziness, weakness and headaches.  All other systems reviewed and are negative.     Allergies  Review of patient's allergies indicates no known allergies.  Home Medications   Prior to Admission medications    Medication Sig Start Date End Date Taking? Authorizing Provider  albuterol (PROVENTIL HFA;VENTOLIN HFA) 108 (90 BASE) MCG/ACT inhaler Inhale 2 puffs into the lungs every 4 (four) hours as needed for wheezing or shortness of breath.    Historical Provider, MD  diphenhydrAMINE (BENADRYL) 25 MG tablet Take 50 mg by mouth See admin instructions. Every other night.    Historical Provider, MD  ibuprofen (ADVIL,MOTRIN) 600 MG tablet Take 1 tablet (600 mg total) by mouth every 6 (six) hours as needed. 04/23/14   Arman Filter, NP  nitrofurantoin, macrocrystal-monohydrate, (MACROBID) 100 MG capsule Take 1 capsule (100 mg total) by mouth 2 (two) times daily. 04/13/14   [name], PA-C  predniSONE (DELTASONE) 20 MG tablet 3 tabs po day one, then 2 tabs daily x 4 days 04/13/14   [name], PA-C   There were no vitals taken for this visit. Physical Exam  Nursing note and vitals reviewed. Constitutional: She is oriented to person, place, and time. She appears well-developed and well-nourished. No distress.  HENT:  Head: Normocephalic.  Nasal congestion  Eyes: Conjunctivae are normal. Pupils are equal, round, and reactive to light.  Neck: Neck supple.  Cardiovascular: Normal rate, regular rhythm and normal heart sounds.   Pulmonary/Chest: Effort normal. No respiratory distress. She has wheezes. She has no rales.  End expiratory wheezing  Musculoskeletal: She exhibits no edema.  Neurological: She is alert and oriented to person, place, and time.  Skin: Skin is warm and dry.  Psychiatric: She has a normal mood and affect. Her behavior is normal.    ED Course  Procedures (including critical care time) Labs Review Labs Reviewed - No data to display  Imaging Review No results found.   EKG Interpretation None      MDM   Final diagnoses:  Acute bronchospasm    Pt with acute asthma exacerbation. Wheezing. Denies cp. Will start nebs, prednisone ordered. Oxygen sat 100% on RA.    2:40 AM Pt  received 2 nebs. Feeling better. Wheezing resolved. Pt is joking and speaking in full sentences. Tachycardic, otherwise normal VS. Doubt PE given hx of asthma, no CP, wheezing.  Home with prednisone, albuterol, follow up with PCP.   Filed Vitals:   05/18/14 0213  BP: 99/60  Pulse: 108  SpO2: 100%       Lottie Mussel, PA-C 05/18/14 0244

## 2014-05-18 NOTE — Discharge Instructions (Signed)
Prednisone as prescribed until all gone. Inhaler 2 puffs every 4 hrs as needed. Avoid smoky areas. Stop smoking. Follow up with your doctor for recheck.    Bronchospasm A bronchospasm is a spasm or tightening of the airways going into the lungs. During a bronchospasm breathing becomes more difficult because the airways get smaller. When this happens there can be coughing, a whistling sound when breathing (wheezing), and difficulty breathing. Bronchospasm is often associated with asthma, but not all patients who experience a bronchospasm have asthma. CAUSES  A bronchospasm is caused by inflammation or irritation of the airways. The inflammation or irritation may be triggered by:   Allergies (such as to animals, pollen, food, or mold). Allergens that cause bronchospasm may cause wheezing immediately after exposure or many hours later.   Infection. Viral infections are believed to be the most common cause of bronchospasm.   Exercise.   Irritants (such as pollution, cigarette smoke, strong odors, aerosol sprays, and paint fumes).   Weather changes. Winds increase molds and pollens in the air. Rain refreshes the air by washing irritants out. Cold air may cause inflammation.   Stress and emotional upset.  SIGNS AND SYMPTOMS   Wheezing.   Excessive nighttime coughing.   Frequent or severe coughing with a simple cold.   Chest tightness.   Shortness of breath.  DIAGNOSIS  Bronchospasm is usually diagnosed through a history and physical exam. Tests, such as chest X-rays, are sometimes done to look for other conditions. TREATMENT   Inhaled medicines can be given to open up your airways and help you breathe. The medicines can be given using either an inhaler or a nebulizer machine.  Corticosteroid medicines may be given for severe bronchospasm, usually when it is associated with asthma. HOME CARE INSTRUCTIONS   Always have a plan prepared for seeking medical care. Know when to  call your health care provider and local emergency services (911 in the [place] Know where you can access local emergency care.  Only take medicines as directed by your health care provider.  If you were prescribed an inhaler or nebulizer machine, ask your health care provider to explain how to use it correctly. Always use a spacer with your inhaler if you were given one.  It is necessary to remain calm during an attack. Try to relax and breathe more slowly.  Control your home environment in the following ways:   Change your heating and air conditioning filter at least once a month.   Limit your use of fireplaces and wood stoves.  Do not smoke and do not allow smoking in your home.   Avoid exposure to perfumes and fragrances.   Get rid of pests (such as roaches and mice) and their droppings.   Throw away plants if you see mold on them.   Keep your house clean and dust free.   Replace carpet with wood, tile, or vinyl flooring. Carpet can trap dander and dust.   Use allergy-proof pillows, mattress covers, and box spring covers.   Wash bed sheets and blankets every week in hot water and dry them in a dryer.   Use blankets that are made of polyester or cotton.   Wash hands frequently. SEEK MEDICAL CARE IF:   You have muscle aches.   You have chest pain.   The sputum changes from clear or white to yellow, green, gray, or bloody.   The sputum you cough up gets thicker.   There are problems that may be related to  the medicine you are given, such as a rash, itching, swelling, or trouble breathing.  SEEK IMMEDIATE MEDICAL CARE IF:   You have worsening wheezing and coughing even after taking your prescribed medicines.   You have increased difficulty breathing.   You develop severe chest pain. MAKE SURE YOU:   Understand these instructions.  Will watch your condition.  Will get help right away if you are not doing well or get worse. Document Released:  08/13/2003 Document Revised: 08/15/2013 Document Reviewed: 01/30/2013 [facility] Patient Information 2015 [place], [place]. This information is not intended to replace advice given to you by your health care provider. Make sure you discuss any questions you have with your health care provider.

## 2014-07-29 ENCOUNTER — Encounter (HOSPITAL_COMMUNITY): Payer: Self-pay | Admitting: Emergency Medicine

## 2014-07-29 ENCOUNTER — Emergency Department (HOSPITAL_COMMUNITY)
Admission: EM | Admit: 2014-07-29 | Discharge: 2014-07-29 | Disposition: A | Discharge status: Home / Self Care | Attending: Emergency Medicine | Admitting: Emergency Medicine

## 2014-07-29 ENCOUNTER — Other Ambulatory Visit: Payer: Self-pay

## 2014-07-29 ENCOUNTER — Emergency Department (HOSPITAL_COMMUNITY)

## 2014-07-29 DIAGNOSIS — N390 Urinary tract infection, site not specified: Secondary | ICD-10-CM

## 2014-07-29 DIAGNOSIS — J45901 Unspecified asthma with (acute) exacerbation: Secondary | ICD-10-CM | POA: Diagnosis not present

## 2014-07-29 DIAGNOSIS — R0602 Shortness of breath: Secondary | ICD-10-CM

## 2014-07-29 DIAGNOSIS — Z3202 Encounter for pregnancy test, result negative: Secondary | ICD-10-CM | POA: Diagnosis not present

## 2014-07-29 DIAGNOSIS — Z720 Tobacco use: Secondary | ICD-10-CM | POA: Insufficient documentation

## 2014-07-29 DIAGNOSIS — R079 Chest pain, unspecified: Secondary | ICD-10-CM | POA: Insufficient documentation

## 2014-07-29 DIAGNOSIS — R51 Headache: Secondary | ICD-10-CM | POA: Insufficient documentation

## 2014-07-29 DIAGNOSIS — Z7952 Long term (current) use of systemic steroids: Secondary | ICD-10-CM | POA: Diagnosis not present

## 2014-07-29 DIAGNOSIS — R200 Anesthesia of skin: Secondary | ICD-10-CM | POA: Insufficient documentation

## 2014-07-29 DIAGNOSIS — R531 Weakness: Secondary | ICD-10-CM | POA: Insufficient documentation

## 2014-07-29 DIAGNOSIS — Z79899 Other long term (current) drug therapy: Secondary | ICD-10-CM | POA: Diagnosis not present

## 2014-07-29 DIAGNOSIS — R1031 Right lower quadrant pain: Secondary | ICD-10-CM | POA: Diagnosis present

## 2014-07-29 LAB — CBC WITH DIFFERENTIAL/PLATELET
BASOS PCT: 1 % (ref 0–1)
Basophils Absolute: 0.1 10*3/uL (ref 0.0–0.1)
Eosinophils Absolute: 1 10*3/uL — ABNORMAL HIGH (ref 0.0–0.7)
Eosinophils Relative: 10 % — ABNORMAL HIGH (ref 0–5)
HCT: 39.9 % (ref 36.0–46.0)
HEMOGLOBIN: 13.5 g/dL (ref 12.0–15.0)
Lymphocytes Relative: 31 % (ref 12–46)
Lymphs Abs: 3.1 10*3/uL (ref 0.7–4.0)
MCH: 24.5 pg — AB (ref 26.0–34.0)
MCHC: 33.8 g/dL (ref 30.0–36.0)
MCV: 72.5 fL — ABNORMAL LOW (ref 78.0–100.0)
Monocytes Absolute: 0.5 10*3/uL (ref 0.1–1.0)
Monocytes Relative: 5 % (ref 3–12)
Neutro Abs: 5.2 10*3/uL (ref 1.7–7.7)
Neutrophils Relative %: 53 % (ref 43–77)
PLATELETS: 339 10*3/uL (ref 150–400)
RBC: 5.5 MIL/uL — ABNORMAL HIGH (ref 3.87–5.11)
RDW: 13.8 % (ref 11.5–15.5)
WBC: 9.9 10*3/uL (ref 4.0–10.5)

## 2014-07-29 LAB — WET PREP, GENITAL
CLUE CELLS WET PREP: NONE SEEN
Trich, Wet Prep: NONE SEEN
WBC, Wet Prep HPF POC: NONE SEEN
Yeast Wet Prep HPF POC: NONE SEEN

## 2014-07-29 LAB — COMPREHENSIVE METABOLIC PANEL
ALT: 8 U/L (ref 0–35)
ANION GAP: 13 (ref 5–15)
AST: 18 U/L (ref 0–37)
Albumin: 4.2 g/dL (ref 3.5–5.2)
Alkaline Phosphatase: 69 U/L (ref 39–117)
BUN: 9 mg/dL (ref 6–23)
CALCIUM: 9.6 mg/dL (ref 8.4–10.5)
CO2: 24 mEq/L (ref 19–32)
Chloride: 103 mEq/L (ref 96–112)
Creatinine, Ser: 0.66 mg/dL (ref 0.50–1.10)
GFR calc non Af Amer: >90 mL/min (ref >90)
GLUCOSE: 79 mg/dL (ref 70–99)
Potassium: 4 mEq/L (ref 3.7–5.3)
Sodium: 140 mEq/L (ref 137–147)
Total Bilirubin: 0.4 mg/dL (ref 0.3–1.2)
Total Protein: 7.3 g/dL (ref 6.0–8.3)

## 2014-07-29 LAB — URINALYSIS, ROUTINE W REFLEX MICROSCOPIC
Bilirubin Urine: NEGATIVE
Glucose, UA: NEGATIVE mg/dL
Hgb urine dipstick: NEGATIVE
Ketones, ur: NEGATIVE mg/dL
LEUKOCYTES UA: NEGATIVE
Nitrite: POSITIVE — AB
PROTEIN: NEGATIVE mg/dL
Specific Gravity, Urine: 1.013 (ref 1.005–1.030)
UROBILINOGEN UA: 0.2 mg/dL (ref 0.0–1.0)
pH: 7.5 (ref 5.0–8.0)

## 2014-07-29 LAB — RAPID URINE DRUG SCREEN, HOSP PERFORMED
Amphetamines: NOT DETECTED
BARBITURATES: NOT DETECTED
BENZODIAZEPINES: NOT DETECTED
Cocaine: NOT DETECTED
Opiates: NOT DETECTED
Tetrahydrocannabinol: NOT DETECTED

## 2014-07-29 LAB — POC URINE PREG, ED: PREG TEST UR: NEGATIVE

## 2014-07-29 LAB — TROPONIN I: Troponin I: <0.3 ng/mL (ref <0.30)

## 2014-07-29 LAB — URINE MICROSCOPIC-ADD ON

## 2014-07-29 MED ORDER — CIPROFLOXACIN HCL 250 MG PO TABS: 250.0000 mg | ORAL_TABLET | Freq: Two times a day (BID) | ORAL | Status: AC

## 2014-07-29 MED ORDER — DEXAMETHASONE SODIUM PHOSPHATE 10 MG/ML IJ SOLN
10.0000 mg | Freq: Once | INTRAMUSCULAR | Status: AC
  Administered 2014-07-29: 10 mg via INTRAVENOUS
  Filled 2014-07-29: qty 1

## 2014-07-29 MED ORDER — ALBUTEROL SULFATE (2.5 MG/3ML) 0.083% IN NEBU
5.0000 mg | INHALATION_SOLUTION | Freq: Once | RESPIRATORY_TRACT | Status: AC
  Administered 2014-07-29: 5 mg via RESPIRATORY_TRACT
  Filled 2014-07-29: qty 6

## 2014-07-29 MED ORDER — CIPROFLOXACIN HCL 500 MG PO TABS
250.0000 mg | ORAL_TABLET | Freq: Once | ORAL | Status: AC
Start: 2014-07-29 — End: 2014-07-29
  Administered 2014-07-29: 250 mg via ORAL
  Filled 2014-07-29: qty 1

## 2014-07-29 MED ORDER — DIPHENHYDRAMINE HCL 50 MG/ML IJ SOLN
25.0000 mg | Freq: Once | INTRAMUSCULAR | Status: AC
  Administered 2014-07-29: 25 mg via INTRAVENOUS
  Filled 2014-07-29: qty 1

## 2014-07-29 MED ORDER — SODIUM CHLORIDE 0.9 % IV BOLUS (SEPSIS)
1000.0000 mL | INTRAVENOUS | Status: AC
  Administered 2014-07-29: 1000 mL via INTRAVENOUS

## 2014-07-29 MED ORDER — KETOROLAC TROMETHAMINE 30 MG/ML IJ SOLN: 30.0000 mg | Freq: Once | INTRAMUSCULAR | Status: DC

## 2014-07-29 MED ORDER — METOCLOPRAMIDE HCL 5 MG/ML IJ SOLN
10.0000 mg | Freq: Once | INTRAMUSCULAR | Status: AC
  Administered 2014-07-29: 10 mg via INTRAVENOUS
  Filled 2014-07-29: qty 2

## 2014-07-29 NOTE — ED Notes (Signed)
Pt c/o lt sided numbness and pain since last night.  States "when my left side goes numb, so does the rest of my body".  Also c/o rt sided abd pain x 2 months.  Has been seeing someone for this and states that she was supposed to follow up for an xray but never did.

## 2014-07-29 NOTE — ED Provider Notes (Signed)
CSN: [id number]     Arrival date & time 07/29/14  1408 History   First MD Initiated Contact with Patient 07/29/14 1500     Chief Complaint  Patient presents with  . Abdominal Pain  . Numbness     (Consider location/radiation/quality/duration/timing/severity/associated sxs/prior Treatment) Patient is a 18 y.o. female presenting with abdominal pain, headaches, and neurologic complaint. The history is provided by the patient.  Abdominal Pain Pain location:  RLQ Pain quality: aching   Pain radiates to:  Does not radiate Pain severity:  Mild Onset quality:  Gradual Duration: 11 months. Timing:  Intermittent Progression since onset: worsening over the last 1.5 mos. Chronicity:  Chronic Context comment:  At rest Relieved by:  Nothing Worsened by:  Nothing tried Ineffective treatments:  None tried Associated symptoms: chest pain, cough and shortness of breath   Associated symptoms: no diarrhea, no dysuria, no fatigue, no fever, no hematuria, no nausea and no vomiting   Chest pain:    Quality:  Aching   Severity:  Mild   Onset quality:  Gradual   Timing:  Constant   Progression:  Unchanged   Chronicity:  New Cough:    Cough characteristics:  Non-productive Headache Pain location:  L temporal Quality:  Dull Radiates to:  Does not radiate Severity currently:  6/10 Severity at highest:  8/10 Onset quality:  Sudden Timing:  Constant Progression:  Unchanged Chronicity:  New Relieved by:  Nothing Worsened by:  Nothing tried Associated symptoms: cough and numbness   Associated symptoms: no abdominal pain, no back pain, no congestion, no diarrhea, no dizziness, no pain, no fatigue, no fever, no nausea, no neck pain and no vomiting   Neurologic Problem This is a new problem. The current episode started 6 to 12 hours ago. The problem occurs constantly. The problem has been gradually worsening. Associated symptoms include chest pain, headaches and shortness of breath. Pertinent  negatives include no abdominal pain. Nothing aggravates the symptoms. Nothing relieves the symptoms. She has tried nothing for the symptoms. The treatment provided no relief.    Past Medical History  Diagnosis Date  . Allergy   . Asthma    Past Surgical History  Procedure Laterality Date  . Knee surgery     No family history on file. History  Substance Use Topics  . Smoking status: Current Some Day Smoker  . Smokeless tobacco: Not on file  . Alcohol Use: Yes     Comment: social   OB History    No data available     Review of Systems  Constitutional: Negative for fever and fatigue.  HENT: Negative for congestion and drooling.   Eyes: Negative for pain.  Respiratory: Positive for cough and shortness of breath.   Cardiovascular: Positive for chest pain.  Gastrointestinal: Negative for nausea, vomiting, abdominal pain and diarrhea.  Genitourinary: Negative for dysuria and hematuria.  Musculoskeletal: Negative for back pain, gait problem and neck pain.  Skin: Negative for color change.  Neurological: Positive for weakness, numbness and headaches. Negative for dizziness.  Hematological: Negative for adenopathy.  Psychiatric/Behavioral: Negative for behavioral problems.  All other systems reviewed and are negative.     Allergies  Review of patient's allergies indicates no known allergies.  Home Medications   Prior to Admission medications   Medication Sig Start Date End Date Taking? Authorizing Provider  albuterol (PROVENTIL HFA;VENTOLIN HFA) 108 (90 BASE) MCG/ACT inhaler Inhale 2 puffs into the lungs every 4 (four) hours as needed for wheezing or shortness of  breath.   Yes Historical Provider, MD  ibuprofen (ADVIL,MOTRIN) 600 MG tablet Take 1 tablet (600 mg total) by mouth every 6 (six) hours as needed. Patient not taking: Reported on 07/29/2014 04/23/14   Arman Filter, NP  montelukast (SINGULAIR) 10 MG tablet Take 1 tablet by mouth every evening. 06/05/14   Historical  Provider, MD  nitrofurantoin, macrocrystal-monohydrate, (MACROBID) 100 MG capsule Take 1 capsule (100 mg total) by mouth 2 (two) times daily. Patient not taking: Reported on 07/29/2014 04/13/14   [name], PA-C  predniSONE (DELTASONE) 10 MG tablet Take 5 tab day 1, take 4 tab day 2, take 3 tab day 3, take 2 tab day 4, and take 1 tab day 5 Patient not taking: Reported on 07/29/2014 05/18/14   Tatyana A Kirichenko, PA-C  predniSONE (DELTASONE) 20 MG tablet 3 tabs po day one, then 2 tabs daily x 4 days Patient not taking: Reported on 07/29/2014 04/13/14   [name], PA-C   BP 107/75 mmHg  Pulse 100  Temp(Src) 98.5 F (36.9 C) (Oral)  Resp 16  SpO2 100% Physical Exam  Constitutional: She is oriented to person, place, and time. She appears well-developed and well-nourished.  HENT:  Head: Normocephalic and atraumatic.  Mouth/Throat: Oropharynx is clear and moist. No oropharyngeal exudate.  Eyes: Conjunctivae and EOM are normal. Pupils are equal, round, and reactive to light.  Neck: Normal range of motion. Neck supple.  Cardiovascular: Normal rate, regular rhythm, normal heart sounds and intact distal pulses.  Exam reveals no gallop and no friction rub.   No murmur heard. Pulmonary/Chest: Effort normal and breath sounds normal. No respiratory distress. She has no wheezes.  Abdominal: Soft. Bowel sounds are normal. There is no tenderness. There is no rebound and no guarding.  Genitourinary:  Normal-appearing external vagina. Normal-appearing cervix. Os closed. No cervical motion tenderness. Mild right adnexal tenderness during bimanual exam.  Musculoskeletal: Normal range of motion. She exhibits no edema or tenderness.  Neurological: She is alert and oriented to person, place, and time. She displays no Babinski's sign on the right side. She displays no Babinski's sign on the left side.  Reflex Scores:      Patellar reflexes are 2+ on the right side and 2+ on the left side.      Achilles reflexes  are 2+ on the right side and 2+ on the left side. alert, oriented x3 speech: normal in context and clarity memory: intact grossly cranial nerves II-XII: altered sensation to light touch in V3 distribution of left lower face, otherwise CN's intact motor strength: full proximally and distally no involuntary movements or tremors Sensation:altered sensation to light touch in LUE,LLE, and V3 distribution of left lower face only, intact to light touch diffusely otherwise cerebellar: finger-to-nose and heel-to-shin intact gait: normal forwards and backwards, normal tandem gait   Skin: Skin is warm and dry.  Psychiatric: She has a normal mood and affect. Her behavior is normal.  Nursing note and vitals reviewed.   ED Course  Procedures (including critical care time) Labs Review Labs Reviewed  CBC WITH DIFFERENTIAL - Abnormal; Notable for the following:    RBC 5.50 (*)    MCV 72.5 (*)    MCH 24.5 (*)    Eosinophils Relative 10 (*)    Eosinophils Absolute 1.0 (*)    All other components within normal limits  URINALYSIS, ROUTINE W REFLEX MICROSCOPIC - Abnormal; Notable for the following:    APPearance CLOUDY (*)    Nitrite POSITIVE (*)  All other components within normal limits  URINE MICROSCOPIC-ADD ON - Abnormal; Notable for the following:    Bacteria, UA MANY (*)    All other components within normal limits  WET PREP, GENITAL  GC/CHLAMYDIA PROBE AMP  URINE CULTURE  COMPREHENSIVE METABOLIC PANEL  TROPONIN I  URINE RAPID DRUG SCREEN (HOSP PERFORMED)  POC URINE PREG, ED    Imaging Review Dg Chest 2 View  07/29/2014   CLINICAL DATA:  Left-sided numbness, shortness of breath, cough  EXAM: CHEST  2 VIEW  COMPARISON:  04/22/2014  FINDINGS: The heart size and mediastinal contours are within normal limits. Both lungs are clear. The visualized skeletal structures are unremarkable.  IMPRESSION: No active cardiopulmonary disease.   Electronically Signed   By: Elige Ko   On: 07/29/2014  15:54   Ct Head Wo Contrast  07/29/2014   CLINICAL DATA:  LEFT-sided numbness beginning yesterday. Initial encounter.  EXAM: CT HEAD WITHOUT CONTRAST  TECHNIQUE: Contiguous axial images were obtained from the base of the skull through the vertex without contrast.  COMPARISON:  None  FINDINGS: Normal appearance of the intracranial structures. No evidence for acute hemorrhage, mass lesion, midline shift, hydrocephalus or large infarct. No acute bony abnormality. The visualized sinuses are clear.  IMPRESSION: No acute intracranial abnormality.   Electronically Signed   By: Davonna Belling M.D.   On: 07/29/2014 15:51     EKG Interpretation None      Date: 07/29/2014  Rate: 80  Rhythm: normal sinus rhythm  QRS Axis: normal  Intervals: normal  ST/T Wave abnormalities: normal  Conduction Disutrbances:none  Narrative Interpretation: No ST or T wave changes consistent with ischemia.    Old EKG Reviewed: unchanged   MDM   Final diagnoses:  Left sided numbness  SOB (shortness of breath)  UTI (lower urinary tract infection)    3:34 PM 18 y.o. female who presents with multiple complaints including chest pain, shortness of breath, abdominal pain, headache, left-sided weakness/numbness/cramping sensation. She states that she awoke around 4 AM this morning with left-sided chest pain and left hand cramping. She states that this turned into left-sided weakness/paresthesias/cramping. She denies any fevers or head injury. She developed a brisk onset headache while at church this morning. Her headache is currently 6 out of 10. She is afebrile and vital signs are unremarkable here. She states that she has altered sensation to light touch in her left upper extremity, left lower extremity, and V3 distribution on the left side of her face. She otherwise has a normal neurologic exam. Unlikely that this is a CVA or arterial dissection in an otherwise healthy 18 year old with no medical problems. Potentially a  complicated migraine.  6:05 PM: Sx improved w/ tx of HA. Pt notes resolution of left sided sx. HA gone. No pain currently. She is ambulatory and continues to appear well. Found to have UTI. Otherwise imaging/labs non-contrib.  I have discussed the diagnosis/risks/treatment options with the patient and family and believe the pt to be eligible for discharge home to follow-up with her pcp. We also discussed returning to the ED immediately if new or worsening sx occur. We discussed the sx which are most concerning (e.g., return of weakness/numbness, HA, fever) that necessitate immediate return. Medications administered to the patient during their visit and any new prescriptions provided to the patient are listed below.  Medications given during this visit Medications  sodium chloride 0.9 % bolus 1,000 mL (0 mLs Intravenous Stopped 07/29/14 1727)  metoCLOPramide (REGLAN) injection 10 mg (  10 mg Intravenous Given 07/29/14 1601)  diphenhydrAMINE (BENADRYL) injection 25 mg (25 mg Intravenous Given 07/29/14 1601)  dexamethasone (DECADRON) injection 10 mg (10 mg Intravenous Given 07/29/14 1601)  albuterol (PROVENTIL) (2.5 MG/3ML) 0.083% nebulizer solution 5 mg (5 mg Nebulization Given 07/29/14 1628)  ciprofloxacin (CIPRO) tablet 250 mg (250 mg Oral Given 07/29/14 1803)    New Prescriptions   CIPROFLOXACIN (CIPRO) 250 MG TABLET    Take 1 tablet (250 mg total) by mouth 2 (two) times daily.     Purvis SheffieldForrest Farin Buhman, MD 07/29/14 [phone]

## 2014-07-29 NOTE — ED Notes (Signed)
Pt reports Lt sided Chest pain constant, and L sided numbness that comes and goes.  She reports that numbness comes on for a couple of min and then goes away, but is also associated with Lt hand cramping.  Pt alert and oriented.  States chest pain under left breast thought it was assoc with her asthma.  Grandmother at bedside reports that pt lives with her.

## 2014-07-30 LAB — GC/CHLAMYDIA PROBE AMP
CT Probe RNA: NEGATIVE
GC Probe RNA: NEGATIVE

## 2014-07-31 LAB — URINE CULTURE: Colony Count: >=100000

## 2014-08-01 ENCOUNTER — Telehealth (HOSPITAL_BASED_OUTPATIENT_CLINIC_OR_DEPARTMENT_OTHER): Payer: Self-pay | Admitting: Emergency Medicine

## 2014-08-01 NOTE — Telephone Encounter (Signed)
Post ED Visit - Positive Culture Follow-up  Culture report reviewed by antimicrobial stewardship pharmacist: []  Wes Dulaney, Pharm.D., BCPS [x]  Celedonio MiyamotoJeremy Frens, Pharm.D., BCPS []  Georgina PillionElizabeth Martin, Pharm.D., BCPS []  RoslynMinh Pham, Vermont[place]arm.D., BCPS, AAHIVP []  Estella HuskMichelle Turner, Pharm.D., BCPS, AAHIVP []  Babs BertinHaley Baird, Pharm.D.    Positive urine culture Treated with ciprofloxacin, organism sensitive to the same and no further patient follow-up is required at this time.  Berle MullMiller, Ura Yingling 08/01/2014, 12:17 PM

## 2014-08-16 ENCOUNTER — Emergency Department (HOSPITAL_COMMUNITY)
Admission: EM | Admit: 2014-08-16 | Discharge: 2014-08-16 | Disposition: A | Discharge status: Home / Self Care | Attending: Emergency Medicine | Admitting: Emergency Medicine

## 2014-08-16 ENCOUNTER — Encounter (HOSPITAL_COMMUNITY): Payer: Self-pay | Admitting: Emergency Medicine

## 2014-08-16 DIAGNOSIS — R000 Tachycardia, unspecified: Secondary | ICD-10-CM | POA: Insufficient documentation

## 2014-08-16 DIAGNOSIS — J45901 Unspecified asthma with (acute) exacerbation: Secondary | ICD-10-CM | POA: Diagnosis not present

## 2014-08-16 DIAGNOSIS — Z720 Tobacco use: Secondary | ICD-10-CM | POA: Insufficient documentation

## 2014-08-16 DIAGNOSIS — R0602 Shortness of breath: Secondary | ICD-10-CM | POA: Diagnosis present

## 2014-08-16 DIAGNOSIS — Z79899 Other long term (current) drug therapy: Secondary | ICD-10-CM | POA: Insufficient documentation

## 2014-08-16 MED ORDER — PREDNISONE 20 MG PO TABS: 60.0000 mg | ORAL_TABLET | Freq: Every day | ORAL | Status: DC

## 2014-08-16 MED ORDER — IPRATROPIUM-ALBUTEROL 0.5-2.5 (3) MG/3ML IN SOLN
3.0000 mL | Freq: Once | RESPIRATORY_TRACT | Status: AC
  Administered 2014-08-16: 3 mL via RESPIRATORY_TRACT
  Filled 2014-08-16: qty 3

## 2014-08-16 MED ORDER — PREDNISONE 20 MG PO TABS
60.0000 mg | ORAL_TABLET | Freq: Once | ORAL | Status: AC
  Administered 2014-08-16: 60 mg via ORAL
  Filled 2014-08-16: qty 3

## 2014-08-16 MED ORDER — ALBUTEROL SULFATE HFA 108 (90 BASE) MCG/ACT IN AERS
1.0000 | INHALATION_SPRAY | RESPIRATORY_TRACT | Status: DC | PRN
  Administered 2014-08-16: 2 via RESPIRATORY_TRACT
  Filled 2014-08-16: qty 6.7

## 2014-08-16 NOTE — ED Notes (Signed)
Pt placed in a gown and hooked up to the cardiac monitor

## 2014-08-16 NOTE — ED Notes (Signed)
Pt presents with shortness of breath that started 2 hours ago- pt noted to be wheezing upon arrival to exam room- breathing treatment started.  Pt reports that she is out of medications and unable to get them filled until Saturday.  Denies CP.  Pt speaking in full sentences at present.

## 2014-08-16 NOTE — ED Provider Notes (Signed)
CSN: [id number]     Arrival date & time 08/16/14  2120 History   First MD Initiated Contact with Patient 08/16/14 2149     Chief Complaint  Patient presents with  . Shortness of Breath    Patient is a 18 y.o. female presenting with shortness of breath. The history is provided by the patient.  Shortness of Breath Severity:  Moderate Onset quality:  Sudden Duration:  2 hours Timing:  Constant (The patient was at work when she started developing a scratchy nose and began feeling short of breath.) Progression:  Improving Chronicity:  Recurrent Animal exposure: she was at work around [employer].   Relieved by: Albuterol inhaler. Associated symptoms: cough and wheezing   Associated symptoms: no abdominal pain, no chest pain, no sore throat and no sputum production    patient felt like she had an asthma attack. She ran out of her inhaler and cannot get it filled to later this week. At triage she was given an albuterol treatment the patient is feeling much better now  Past Medical History  Diagnosis Date  . Allergy   . Asthma    Past Surgical History  Procedure Laterality Date  . Knee surgery     No family history on file. History  Substance Use Topics  . Smoking status: Current Some Day Smoker  . Smokeless tobacco: Not on file  . Alcohol Use: Yes     Comment: social   OB History    No data available     Review of Systems  HENT: Negative for sore throat.   Respiratory: Positive for cough, shortness of breath and wheezing. Negative for sputum production.   Cardiovascular: Negative for chest pain.  Gastrointestinal: Negative for abdominal pain.  All other systems reviewed and are negative.     Allergies  Review of patient's allergies indicates no known allergies.  Home Medications   Prior to Admission medications   Medication Sig Start Date End Date Taking? Authorizing Provider  albuterol (PROVENTIL HFA;VENTOLIN HFA) 108 (90 BASE) MCG/ACT inhaler Inhale 2 puffs into  the lungs every 4 (four) hours as needed for wheezing or shortness of breath.   Yes Historical Provider, MD  diphenhydrAMINE (SOMINEX) 25 MG tablet Take 25 mg by mouth at bedtime as needed for itching or sleep.   Yes Historical Provider, MD  ibuprofen (ADVIL,MOTRIN) 600 MG tablet Take 1 tablet (600 mg total) by mouth every 6 (six) hours as needed. Patient not taking: Reported on 07/29/2014 04/23/14   Arman FilterGail K Schulz, NP  montelukast (SINGULAIR) 10 MG tablet Take 1 tablet by mouth every evening. 06/05/14   Historical Provider, MD  nitrofurantoin, macrocrystal-monohydrate, (MACROBID) 100 MG capsule Take 1 capsule (100 mg total) by mouth 2 (two) times daily. Patient not taking: Reported on 07/29/2014 04/13/14   Fayrene HelperBowie Tran, PA-C  predniSONE (DELTASONE) 10 MG tablet Take 5 tab day 1, take 4 tab day 2, take 3 tab day 3, take 2 tab day 4, and take 1 tab day 5 Patient not taking: Reported on 07/29/2014 05/18/14   Tatyana A Kirichenko, PA-C  predniSONE (DELTASONE) 20 MG tablet 3 tabs po day one, then 2 tabs daily x 4 days Patient not taking: Reported on 07/29/2014 04/13/14   Fayrene HelperBowie Tran, PA-C   BP 106/78 mmHg  Pulse 126  Temp(Src) 97.4 F (36.3 C) (Oral)  Resp 20  SpO2 98%  LMP 07/21/2014 Physical Exam  Constitutional: She appears well-developed and well-nourished. No distress.  HENT:  Head: Normocephalic and  atraumatic.  Right Ear: External ear normal.  Left Ear: External ear normal.  Eyes: Conjunctivae are normal. Right eye exhibits no discharge. Left eye exhibits no discharge. No scleral icterus.  Neck: Neck supple. No tracheal deviation present.  Cardiovascular: Intact distal pulses.  Tachycardia present.   Pulmonary/Chest: Effort normal and breath sounds normal. No stridor. No respiratory distress. She has no rales.  Very faint sporadic wheezes noted on end expiration, the patient is able to speak in full sentences without difficulty  Abdominal: Soft. Bowel sounds are normal. She exhibits no  distension. There is no tenderness. There is no rebound and no guarding.  Musculoskeletal: She exhibits no edema or tenderness.  Neurological: She is alert. She has normal strength. No cranial nerve deficit (no facial droop, extraocular movements intact, no slurred speech) or sensory deficit. She exhibits normal muscle tone. She displays no seizure activity. Coordination normal.  Skin: Skin is warm and dry. No rash noted.  Psychiatric: She has a normal mood and affect.  Nursing note and vitals reviewed.   ED Course  Procedures (including critical care time) Labs Review Labs Reviewed - No data to display  Imaging Review No results found.   EKG Interpretation   Date/Time:  Thursday August 16 2014 21:42:14 EST Ventricular Rate:  124 PR Interval:  172 QRS Duration: 66 QT Interval:  252 QTC Calculation: 362 R Axis:   75 Text Interpretation:  Sinus tachycardia Septal infarct , age undetermined  Abnormal ECG Since last tracing rate faster Confirmed by Monta Maiorana  MD-J, Satori Krabill  ([id number]) on 08/16/2014 10:04:37 PM      MDM   Final diagnoses:  Asthma exacerbation    Pt improved with one albuterol neb.  Will dc home with an albuterol inhaler and give her oral steroids.  At this time there does not appear to be any evidence of an acute emergency medical condition and the patient appears stable for discharge with appropriate outpatient follow up.     Linwood DibblesJon Levander Katzenstein, MD 08/16/14 [phone]

## 2014-08-16 NOTE — Discharge Instructions (Signed)

## 2016-04-25 ENCOUNTER — Inpatient Hospital Stay (HOSPITAL_COMMUNITY)
Admission: EM | Admit: 2016-04-25 | Discharge: 2016-04-28 | DRG: 872 | Disposition: A | Discharge status: Home / Self Care | Payer: Self-pay | Attending: Internal Medicine | Admitting: Internal Medicine

## 2016-04-25 ENCOUNTER — Encounter (HOSPITAL_COMMUNITY): Payer: Self-pay | Admitting: Emergency Medicine

## 2016-04-25 DIAGNOSIS — E872 Acidosis: Secondary | ICD-10-CM | POA: Diagnosis present

## 2016-04-25 DIAGNOSIS — D509 Iron deficiency anemia, unspecified: Secondary | ICD-10-CM | POA: Diagnosis present

## 2016-04-25 DIAGNOSIS — A419 Sepsis, unspecified organism: Principal | ICD-10-CM | POA: Diagnosis present

## 2016-04-25 DIAGNOSIS — E871 Hypo-osmolality and hyponatremia: Secondary | ICD-10-CM | POA: Diagnosis present

## 2016-04-25 DIAGNOSIS — N12 Tubulo-interstitial nephritis, not specified as acute or chronic: Secondary | ICD-10-CM | POA: Diagnosis present

## 2016-04-25 DIAGNOSIS — J4520 Mild intermittent asthma, uncomplicated: Secondary | ICD-10-CM | POA: Diagnosis present

## 2016-04-25 DIAGNOSIS — R109 Unspecified abdominal pain: Secondary | ICD-10-CM

## 2016-04-25 DIAGNOSIS — E876 Hypokalemia: Secondary | ICD-10-CM | POA: Diagnosis present

## 2016-04-25 DIAGNOSIS — B9620 Unspecified Escherichia coli [E. coli] as the cause of diseases classified elsewhere: Secondary | ICD-10-CM | POA: Diagnosis present

## 2016-04-25 DIAGNOSIS — Z825 Family history of asthma and other chronic lower respiratory diseases: Secondary | ICD-10-CM

## 2016-04-25 DIAGNOSIS — E860 Dehydration: Secondary | ICD-10-CM | POA: Diagnosis present

## 2016-04-25 LAB — CBC
HCT: 38 % (ref 36.0–46.0)
Hemoglobin: 13.6 g/dL (ref 12.0–15.0)
MCH: 25.5 pg — ABNORMAL LOW (ref 26.0–34.0)
MCHC: 35.8 g/dL (ref 30.0–36.0)
MCV: 71.2 fL — ABNORMAL LOW (ref 78.0–100.0)
PLATELETS: 221 10*3/uL (ref 150–400)
RBC: 5.34 MIL/uL — ABNORMAL HIGH (ref 3.87–5.11)
RDW: 13.1 % (ref 11.5–15.5)
WBC: 13.6 10*3/uL — AB (ref 4.0–10.5)

## 2016-04-25 LAB — URINALYSIS, ROUTINE W REFLEX MICROSCOPIC
Glucose, UA: NEGATIVE mg/dL
NITRITE: POSITIVE — AB
Protein, ur: 30 mg/dL — AB
Specific Gravity, Urine: 1.024 (ref 1.005–1.030)
pH: 5.5 (ref 5.0–8.0)

## 2016-04-25 LAB — URINE MICROSCOPIC-ADD ON

## 2016-04-25 LAB — COMPREHENSIVE METABOLIC PANEL
ALT: 12 U/L — ABNORMAL LOW (ref 14–54)
AST: 21 U/L (ref 15–41)
Albumin: 4.5 g/dL (ref 3.5–5.0)
Alkaline Phosphatase: 49 U/L (ref 38–126)
Anion gap: 12 (ref 5–15)
BILIRUBIN TOTAL: 1.2 mg/dL (ref 0.3–1.2)
BUN: 9 mg/dL (ref 6–20)
CO2: 21 mmol/L — ABNORMAL LOW (ref 22–32)
Calcium: 9.2 mg/dL (ref 8.9–10.3)
Chloride: 101 mmol/L (ref 101–111)
Creatinine, Ser: 0.88 mg/dL (ref 0.44–1.00)
GFR calc Af Amer: >60 mL/min (ref >60)
Glucose, Bld: 98 mg/dL (ref 65–99)
POTASSIUM: 3.3 mmol/L — AB (ref 3.5–5.1)
Sodium: 134 mmol/L — ABNORMAL LOW (ref 135–145)
TOTAL PROTEIN: 8.3 g/dL — AB (ref 6.5–8.1)

## 2016-04-25 LAB — I-STAT CG4 LACTIC ACID, ED
LACTIC ACID, VENOUS: 2.23 mmol/L — AB (ref 0.5–1.9)
LACTIC ACID, VENOUS: 1.42 mmol/L (ref 0.5–1.9)

## 2016-04-25 LAB — WET PREP, GENITAL
Sperm: NONE SEEN
TRICH WET PREP: NONE SEEN
YEAST WET PREP: NONE SEEN

## 2016-04-25 LAB — I-STAT BETA HCG BLOOD, ED (MC, WL, AP ONLY)

## 2016-04-25 LAB — LIPASE, BLOOD: Lipase: 25 U/L (ref 11–51)

## 2016-04-25 MED ORDER — ACETAMINOPHEN 325 MG PO TABS
650.0000 mg | ORAL_TABLET | Freq: Once | ORAL | Status: AC | PRN
  Administered 2016-04-25: 650 mg via ORAL
  Filled 2016-04-25: qty 2

## 2016-04-25 MED ORDER — KETOROLAC TROMETHAMINE 30 MG/ML IJ SOLN: 30.0000 mg | Freq: Four times a day (QID) | INTRAMUSCULAR | Status: DC | PRN

## 2016-04-25 MED ORDER — POTASSIUM CHLORIDE IN NACL 20-0.9 MEQ/L-% IV SOLN
INTRAVENOUS | Status: AC
  Administered 2016-04-25: 23:00:00 via INTRAVENOUS
  Administered 2016-04-26: 1000 mL via INTRAVENOUS
  Filled 2016-04-25 (×2): qty 1000

## 2016-04-25 MED ORDER — ENSURE ENLIVE PO LIQD: 237.0000 mL | Freq: Two times a day (BID) | ORAL | Status: DC

## 2016-04-25 MED ORDER — ACETAMINOPHEN 325 MG PO TABS
650.0000 mg | ORAL_TABLET | Freq: Four times a day (QID) | ORAL | Status: DC | PRN
  Administered 2016-04-26 (×2): 650 mg via ORAL
  Filled 2016-04-25 (×2): qty 2

## 2016-04-25 MED ORDER — FENTANYL CITRATE (PF) 100 MCG/2ML IJ SOLN
50.0000 ug | Freq: Once | INTRAMUSCULAR | Status: AC
  Administered 2016-04-25: 50 ug via INTRAVENOUS
  Filled 2016-04-25: qty 2

## 2016-04-25 MED ORDER — ENOXAPARIN SODIUM 40 MG/0.4ML ~~LOC~~ SOLN
40.0000 mg | Freq: Every day | SUBCUTANEOUS | Status: DC
  Administered 2016-04-25 – 2016-04-27 (×3): 40 mg via SUBCUTANEOUS
  Filled 2016-04-25 (×3): qty 0.4

## 2016-04-25 MED ORDER — DEXTROSE 5 % IV SOLN
1.0000 g | Freq: Once | INTRAVENOUS | Status: AC
  Administered 2016-04-25: 1 g via INTRAVENOUS
  Filled 2016-04-25: qty 10

## 2016-04-25 MED ORDER — ONDANSETRON 4 MG PO TBDP
4.0000 mg | ORAL_TABLET | Freq: Once | ORAL | Status: AC | PRN
  Administered 2016-04-25: 4 mg via ORAL
  Filled 2016-04-25: qty 1

## 2016-04-25 MED ORDER — DEXTROSE 5 % IV SOLN
1.0000 g | INTRAVENOUS | Status: DC
  Administered 2016-04-26 – 2016-04-27 (×2): 1 g via INTRAVENOUS
  Filled 2016-04-25 (×2): qty 10

## 2016-04-25 MED ORDER — OXYCODONE HCL 5 MG PO TABS
5.0000 mg | ORAL_TABLET | ORAL | Status: DC | PRN
  Administered 2016-04-25 – 2016-04-26 (×3): 5 mg via ORAL
  Filled 2016-04-25 (×4): qty 1

## 2016-04-25 MED ORDER — ONDANSETRON HCL 4 MG/2ML IJ SOLN
4.0000 mg | Freq: Four times a day (QID) | INTRAMUSCULAR | Status: DC | PRN
  Filled 2016-04-25: qty 2

## 2016-04-25 MED ORDER — ONDANSETRON HCL 4 MG PO TABS: 4.0000 mg | ORAL_TABLET | Freq: Four times a day (QID) | ORAL | Status: DC | PRN

## 2016-04-25 MED ORDER — SODIUM CHLORIDE 0.9 % IV BOLUS (SEPSIS)
2000.0000 mL | Freq: Once | INTRAVENOUS | Status: AC
Start: 2016-04-25 — End: 2016-04-25
  Administered 2016-04-25: 2000 mL via INTRAVENOUS

## 2016-04-25 MED ORDER — ACETAMINOPHEN 650 MG RE SUPP: 650.0000 mg | Freq: Four times a day (QID) | RECTAL | Status: DC | PRN

## 2016-04-25 MED ORDER — SODIUM CHLORIDE 0.9 % IV BOLUS (SEPSIS)
1000.0000 mL | Freq: Once | INTRAVENOUS | Status: AC
  Administered 2016-04-25: 1000 mL via INTRAVENOUS

## 2016-04-25 NOTE — ED Notes (Signed)
[place] MD aware of pt status, complaint, and trending assessment since arrival.

## 2016-04-25 NOTE — ED Provider Notes (Signed)
[facility]-EMERGENCY DEPT Provider Note   CSN: [id number] Arrival date & time: 04/25/16  1514     History   Chief Complaint Chief Complaint  Patient presents with  . Abdominal Pain  . Emesis  . Fever    HPI Carol Ramos is a 20 y.o. female.  20 yo AA female with pmh significant for UTI, presents to the ED today for LLQ pain, left flank pain, emesis, and fever for the past 3 days. Patient states that her symptoms have worsened. She endorses left flank pain that radiates to the LLQ. Describes the pain as sharp. Nothing makes better or worse. She has tried tylenol for the pain and fever however she has been unable to keep medicine down.  Subjective fevers at home. Patient has not tried anything for the nausea. He denies any headache, vision changes, chest pain, shortness of breath, urinary symptoms, vaginal bleeding, vaginal discharge, change in bowel habits. Patient is unable to keep anything po down for the past 2 days.      Past Medical History:  Diagnosis Date  . Allergy   . Asthma     There are no active problems to display for this patient.   Past Surgical History:  Procedure Laterality Date  . KNEE SURGERY      OB History    No data available       Home Medications    Prior to Admission medications   Medication Sig Start Date End Date Taking? Authorizing Provider  acetaminophen (TYLENOL) 325 MG tablet Take 650 mg by mouth every 6 (six) hours as needed for fever or headache.   Yes Historical Provider, MD  albuterol (PROVENTIL HFA;VENTOLIN HFA) 108 (90 BASE) MCG/ACT inhaler Inhale 2 puffs into the lungs every 4 (four) hours as needed for wheezing or shortness of breath.   Yes Historical Provider, MD    Family History No family history on file.  Social History Social History  Substance Use Topics  . Smoking status: Never Smoker  . Smokeless tobacco: Not on file  . Alcohol use No     Allergies   Peanut-containing drug products   Review of  Systems Review of Systems  Constitutional: Positive for chills and fever.  HENT: Negative for congestion, ear pain and sore throat.   Eyes: Negative for pain and visual disturbance.  Respiratory: Negative for cough and shortness of breath.   Cardiovascular: Negative for chest pain, palpitations and leg swelling.  Gastrointestinal: Positive for abdominal pain, nausea and vomiting. Negative for diarrhea.  Genitourinary: Positive for flank pain. Negative for dysuria, frequency, hematuria, urgency, vaginal bleeding and vaginal discharge.  Musculoskeletal: Negative for arthralgias and back pain.  Skin: Negative for color change and rash.  Neurological: Negative for dizziness, syncope, weakness, light-headedness, numbness and headaches.  All other systems reviewed and are negative.    Physical Exam Updated Vital Signs BP 110/77 (BP Location: Right Arm)   Pulse 106   Temp 99 F (37.2 C) (Oral)   Resp 17   Ht 5\' 3"  (1.6 m)   Wt 52.2 kg   LMP 04/08/2016   SpO2 100%   BMI 20.37 kg/m   Physical Exam  Constitutional: She appears well-developed and well-nourished. No distress.  Patient appears uncomfortable in bed due to pain.  HENT:  Head: Normocephalic and atraumatic.  Mouth/Throat: Oropharynx is clear and moist.  Eyes: Conjunctivae are normal. Pupils are equal, round, and reactive to light. Right eye exhibits no discharge. Left eye exhibits no discharge. No scleral  icterus.  Neck: Normal range of motion. Neck supple. No thyromegaly present.  Cardiovascular: Normal rate, regular rhythm, normal heart sounds and intact distal pulses.   Pulmonary/Chest: Effort normal and breath sounds normal.  Abdominal: Soft. Bowel sounds are normal. She exhibits no distension. There is tenderness in the suprapubic area and left lower quadrant. There is CVA tenderness (left). There is no rigidity, no rebound, no guarding, no tenderness at McBurney's point and negative Murphy's sign.  Genitourinary: There  is no tenderness on the right labia. There is no tenderness on the left labia. Cervix exhibits no motion tenderness and no discharge. Right adnexum displays no mass, no tenderness and no fullness. Left adnexum displays no mass, no tenderness and no fullness. No erythema, tenderness or bleeding in the vagina. No vaginal discharge found.  Genitourinary Comments: Chaperon present for exam  Musculoskeletal: Normal range of motion.  Lymphadenopathy:    She has no cervical adenopathy.  Neurological: She is alert.  Skin: Skin is warm and dry. Capillary refill takes less than 2 seconds.  Nursing note and vitals reviewed.    ED Treatments / Results  Labs (all labs ordered are listed, but only abnormal results are displayed) Labs Reviewed  COMPREHENSIVE METABOLIC PANEL - Abnormal; Notable for the following:       Result Value   Sodium 134 (*)    Potassium 3.3 (*)    CO2 21 (*)    Total Protein 8.3 (*)    ALT 12 (*)    All other components within normal limits  CBC - Abnormal; Notable for the following:    WBC 13.6 (*)    RBC 5.34 (*)    MCV 71.2 (*)    MCH 25.5 (*)    All other components within normal limits  URINALYSIS, ROUTINE W REFLEX MICROSCOPIC (NOT AT [facility]) - Abnormal; Notable for the following:    Color, Urine AMBER (*)    APPearance CLOUDY (*)    Hgb urine dipstick MODERATE (*)    Bilirubin Urine SMALL (*)    Ketones, ur >80 (*)    Protein, ur 30 (*)    Nitrite POSITIVE (*)    Leukocytes, UA SMALL (*)    All other components within normal limits  URINE MICROSCOPIC-ADD ON - Abnormal; Notable for the following:    Squamous Epithelial / LPF 6-30 (*)    Bacteria, UA MANY (*)    All other components within normal limits  I-STAT CG4 LACTIC ACID, ED - Abnormal; Notable for the following:    Lactic Acid, Venous 2.23 (*)    All other components within normal limits  CULTURE, BLOOD (ROUTINE X 2)  CULTURE, BLOOD (ROUTINE X 2)  WET PREP, GENITAL  URINE CULTURE  LIPASE, BLOOD   I-STAT BETA HCG BLOOD, ED ([facility], [facility], [facility] ONLY)  GC/CHLAMYDIA PROBE AMP ([facility]) NOT AT [facility]    EKG  EKG Interpretation None       Radiology No results found.  Procedures Procedures (including critical care time)  Medications Ordered in ED Medications  ondansetron (ZOFRAN-ODT) disintegrating tablet 4 mg (4 mg Oral Given 04/25/16 1531)  acetaminophen (TYLENOL) tablet 650 mg (650 mg Oral Given 04/25/16 1602)  sodium chloride 0.9 % bolus 2,000 mL (2,000 mLs Intravenous New Bag/Given 04/25/16 1723)  fentaNYL (SUBLIMAZE) injection 50 mcg (50 mcg Intravenous Given 04/25/16 1724)  cefTRIAXone (ROCEPHIN) 1 g in dextrose 5 % 50 mL IVPB (0 g Intravenous Stopped 04/25/16 1814)     Initial Impression / Assessment and  Plan / ED Course  I have reviewed the triage vital signs and the nursing notes.  Pertinent labs & imaging results that were available during my care of the patient were reviewed by me and considered in my medical decision making (see chart for details).  Clinical Course  Patient presented to the ED today with Worsening left flank pain, left lower quadrant pain, fever, emesis. Patient was febrile and tachycardic in ED. Mild leukocytosis. UA revealed nitrites, pyuria, hematuria, bacteriuria. CVA tenderness on the left flank. Concern for pyelonephritis. Pelvic exam unremarkable. Wet prep showed clue cells. HCG was negative in ED. Blood cultures 2 were obtained prior to antibiotics given. Urine culture added. Lactic acid was 2.23. 30 mL per KG fluid bolus ordered. Repeat lactic after fluid bolus was 1.42. Patient still tachycardic after fluid bolus. Patient was given 1 g of Rocephin in ED for suspected polynephritis. Pain and nausea controlled with medications. Discussed plan of care with Dr. Ranae Palms. Agrees that patient probably should be kept overnight for observation since still tachycardic. Spoke with hospital medicine and they agreed to admit patient.   Final Clinical Impressions(s)  / ED Diagnoses   Final diagnoses:  Pyelonephritis    New Prescriptions New Prescriptions   No medications on file     Rise Mu, PA-C 04/26/16 1610    Loren Racer, MD 04/26/16 [phone]

## 2016-04-25 NOTE — H&P (Signed)
History and Physical  Patient Name: Carol Ramos     ZOX:[id number]    DOB: 1996/08/12    DOA: 04/25/2016 PCP: PROVIDER NOT IN SYSTEM [facility], no real PCP  Patient coming from: Home  Chief Complaint: Flank pain  HPI: Carol Ramos is a 20 y.o. female with a past medical history significant for mild intermittent asthma who presents with flank pain and chills for 3 days.  3 days ago the patient had gradual onset left flank pain, achy in character, sore. Over the next 2 days this developed into an intermittent fever and chills, sweats, myalgias, and vomiting. She also noted foul-smelling urine and urinary hesitancy. By today she had had worsening fever chills myalgias, and hadn't been able to keep anything down for 2 days so she came to the ER.  ED course: -Temp 102.9F, heart rate 130s, respirations 18, blood pressure 110s/80s, pulse oximetry normal -Na 134, K 3.3, Cr 0.88 (baseline known), WBC 13.6 K, Hgb 13.6, lipase normal -Lactic acid 2.23 -Urinalysis with nitrites, pyuria, hematuria, bacteriuria -Wet prep with clue cells -HCG negative     ROS: Review of Systems  Constitutional: Positive for chills, diaphoresis, fever and malaise/fatigue.  Respiratory: Positive for wheezing.   Gastrointestinal: Positive for abdominal pain.  Genitourinary: Positive for dysuria, flank pain and urgency.  Musculoskeletal: Positive for myalgias.  All other systems reviewed and are negative.         Past Medical History:  Diagnosis Date  . Allergy   . Asthma     Past Surgical History:  Procedure Laterality Date  . KNEE SURGERY      Social History: Patient lives with her girlfriend.  The patient walks unassisted.  She is not currently employed.  From [facility] originally.  Non-smoker.  No alcohol, no IVDU.    Allergies  Allergen Reactions  . Peanut-Containing Drug Products Other (See Comments)    Difficulty breathing, nose itching    Family history: family  history includes Asthma in her mother.  Prior to Admission medications   Medication Sig Start Date End Date Taking? Authorizing Provider  acetaminophen (TYLENOL) 325 MG tablet Take 650 mg by mouth every 6 (six) hours as needed for fever or headache.   Yes Historical Provider, MD  albuterol (PROVENTIL HFA;VENTOLIN HFA) 108 (90 BASE) MCG/ACT inhaler Inhale 2 puffs into the lungs every 4 (four) hours as needed for wheezing or shortness of breath.   Yes Historical Provider, MD       Physical Exam: BP 101/65 (BP Location: Right Arm)   Pulse 102   Temp 99 F (37.2 C) (Oral)   Resp 16   Ht 5\' 3"  (1.6 m)   Wt 52.2 kg (115 lb)   LMP 04/08/2016   SpO2 100%   BMI 20.37 kg/m  General appearance: Well-developed, small stature adult female, alert and in no acute distress.   Eyes: Anicteric, conjunctiva pink, lids and lashes normal.     ENT: No nasal deformity, discharge, or epistaxis.  OP moist without lesions.   Skin: Warm and dry.  No suspicious rashes or lesions. Cardiac: Tachycardic, regular, nl S1-S2, no murmurs appreciated.  Capillary refill is brisk.  JVP normal.  No LE edema.  Radial and DP pulses 2+ and symmetric. Respiratory: Normal respiratory rate and rhythm.  CTAB without rales or wheezes. GI: Abdomen soft without rigidity.  Mild left-sided TTP with voluntary guarding . No ascites, distension, hepatosplenomegaly.  Left-sided CVA tenderness, very mild MSK: No deformities or effusions.  No clubbing/cyanosis. Neuro: Cranial nerves normal. Sensorium intact and responding to questions, attention normal.  Speech is fluent.  Moves all extremities equally and with normal coordination.    Psych: Affect normal.  Judgment and insight appear normal.       Labs on Admission:  I have personally reviewed following labs and imaging studies: CBC:  Recent Labs Lab 04/25/16 1545  WBC 13.6*  HGB 13.6  HCT 38.0  MCV 71.2*  PLT 221   Basic Metabolic Panel:  Recent Labs Lab 04/25/16 1545    NA 134*  K 3.3*  CL 101  CO2 21*  GLUCOSE 98  BUN 9  CREATININE 0.88  CALCIUM 9.2   GFR: Estimated Creatinine Clearance: 83.3 mL/min (by C-G formula based on SCr of 0.88 mg/dL).  Liver Function Tests:  Recent Labs Lab 04/25/16 1545  AST 21  ALT 12*  ALKPHOS 49  BILITOT 1.2  PROT 8.3*  ALBUMIN 4.5    Recent Labs Lab 04/25/16 1545  LIPASE 25   Sepsis Labs: Lactic acid 2.23 --> 1.43  Recent Results (from the past 240 hour(s))  Wet prep, genital     Status: Abnormal   Collection Time: 04/25/16  5:50 PM  Result Value Ref Range Status   Yeast Wet Prep HPF POC NONE SEEN NONE SEEN Final   Trich, Wet Prep NONE SEEN NONE SEEN Final   Clue Cells Wet Prep HPF POC PRESENT (A) NONE SEEN Final   WBC, Wet Prep HPF POC MANY (A) NONE SEEN Final   Sperm NONE SEEN  Final           Assessment/Plan 1. Early sepsis from pyelonephritis:  Suspected source Urine. Organism unknown. Patient meets criteria given tachycardia, fever, leukocytosis, and evidence of organ dysfunction.  Lactate 2.23 mmol/L and repeat ordered within 6 hours.  This patient is not at high risk of poor outcomes with a SOFA score of 0.  Antibiotics delivered in the ED.    -Sepsis bundle utilized:  -Blood and urine cultures drawn  -30 ml/kg bolus given in ED, repeat lactate normalized  -Start targeted antibiotics with ceftriaxone, based on suspected source of infection    -Repeat renal function and complete blood count in AM  -Ondansetron PRN for nausea, Tylenol, oxycodone or Ketorolac PRN for pain      2. Hyponatremia:  Mild, in setting of dehydration. -Repeat BMP tomorrow  3. Hypokalemia:  Mild. -IVF with K overnight  4. Mild asthma:  Not clinically active  5. Bacterial vaginosis:  Incidental finding.  No discharge by history, and no discharge on exam per EDP report.  Defer Flagyl. -Follow up with PCP if new discharge     DVT prophylaxis: Loevnox  Code Status: FULL  Family Communication:  Grandparents present at bedside  Disposition Plan: Anticipate observation overnight and IV fluids.  If HR improved, no further fever, and electrolytes improved in AM, can repeat ceftriaxone tomorrow night and discharge. Consults called: None Admission status: OBS, med surg At the point of initial evaluation, it is my clinical opinion that admission for OBSERVATION is reasonable and necessary because the patient's presenting complaints in the context of their chronic conditions represent sufficient risk of deterioration or significant morbidity to constitute reasonable grounds for close observation in the hospital setting, but that the patient may be medically stable for discharge from the hospital within 24 to 48 hours.    Medical decision making: Patient seen at 9:30 PM on 04/25/2016.  The patient was discussed with Dr. Ranae Palms.  What exists of the patient's chart was reviewed in depth.  Clinical condition: stable.        Alberteen SamChristopher P Blayre Papania [facility] Pager [phone]

## 2016-04-25 NOTE — ED Triage Notes (Signed)
Pt complaint of continued LLQ pain, emesis, and fever for four days. Pt denies diarrhea or urinary symptoms.

## 2016-04-26 ENCOUNTER — Observation Stay (HOSPITAL_COMMUNITY): Payer: Self-pay

## 2016-04-26 DIAGNOSIS — A419 Sepsis, unspecified organism: Principal | ICD-10-CM

## 2016-04-26 DIAGNOSIS — N12 Tubulo-interstitial nephritis, not specified as acute or chronic: Secondary | ICD-10-CM

## 2016-04-26 LAB — BASIC METABOLIC PANEL
ANION GAP: 4 — AB (ref 5–15)
BUN: 7 mg/dL (ref 6–20)
CALCIUM: 7.4 mg/dL — AB (ref 8.9–10.3)
CHLORIDE: 107 mmol/L (ref 101–111)
CO2: 22 mmol/L (ref 22–32)
Creatinine, Ser: 0.79 mg/dL (ref 0.44–1.00)
GFR calc Af Amer: >60 mL/min (ref >60)
GFR calc non Af Amer: >60 mL/min (ref >60)
GLUCOSE: 94 mg/dL (ref 65–99)
Potassium: 3.7 mmol/L (ref 3.5–5.1)
Sodium: 133 mmol/L — ABNORMAL LOW (ref 135–145)

## 2016-04-26 LAB — CBC
HCT: 28.8 % — ABNORMAL LOW (ref 36.0–46.0)
HEMOGLOBIN: 10 g/dL — AB (ref 12.0–15.0)
MCH: 25 pg — AB (ref 26.0–34.0)
MCHC: 34.7 g/dL (ref 30.0–36.0)
MCV: 72 fL — AB (ref 78.0–100.0)
Platelets: 171 10*3/uL (ref 150–400)
RBC: 4 MIL/uL (ref 3.87–5.11)
RDW: 13.1 % (ref 11.5–15.5)
WBC: 8.9 10*3/uL (ref 4.0–10.5)

## 2016-04-26 MED ORDER — KETOROLAC TROMETHAMINE 30 MG/ML IJ SOLN
30.0000 mg | Freq: Four times a day (QID) | INTRAMUSCULAR | Status: DC
  Administered 2016-04-26 – 2016-04-27 (×3): 30 mg via INTRAVENOUS
  Filled 2016-04-26 (×5): qty 1

## 2016-04-26 MED ORDER — METHOCARBAMOL 1000 MG/10ML IJ SOLN
500.0000 mg | Freq: Four times a day (QID) | INTRAVENOUS | Status: DC | PRN
  Administered 2016-04-26: 500 mg via INTRAVENOUS
  Filled 2016-04-26: qty 550
  Filled 2016-04-26: qty 5

## 2016-04-26 NOTE — Progress Notes (Signed)
[facility] Progress Note  Patient: Carol Ramos [id number]   PCP: PROVIDER NOT IN SYSTEM DOB: July 02, 1996   DOA: 04/25/2016   DOS: 04/26/2016   Date of Service: the patient was seen and examined on 04/26/2016  Subjective: Continues to have left flank pain. Also complaining about nausea. Also had fever this morning with chills. Nutrition: Tolerating oral diet, minimal oral intake  Brief hospital course: Pt. with no significant PMH; admitted on 04/25/2016, with complaint of nausea with left flank pain, was found to have pyelonephritis.. Currently further plan is continue IV antibiotics.  Assessment and Plan: 1. Sepsis ([place]) With pyelonephritis. With fever, leukocytosis, lactic acidosis pt met sepsis criteria. Patient has left flank pain. I will get CT renal stone study to rule out any acute obstruction. Continue IV ceftriaxone and continue IV fluids. When necessary Zofran for symptom control. Next on when necessary Toradol for pain management.  2. Hypokalemia. Resolved. We'll continue to monitor and continue IV fluids with potassium.  3. Anemia. Likely chronic. Probably iron deficiency given low MCV. Will check iron studies.  4. Not pregnant  Pain management: He had and Tylenol, when necessary Toradol Activity: No indication for physical therapy Bowel regimen: last BM prior to admission Diet: Regular diet DVT Prophylaxis: subcutaneous Heparin  Advance goals of care discussion: Full code  Family Communication: family was present at bedside, at the time of interview. The pt provided permission to discuss medical plan with the family. Opportunity was given to ask question and all questions were answered satisfactorily.   Disposition:  Discharge to home. Expected discharge date: 04/28/2016, improvement in sepsis physiology  Consultants: None Procedures: None  Antibiotics: Anti-infectives    Start     Dose/Rate Route Frequency Ordered Stop   04/26/16 1800   cefTRIAXone (ROCEPHIN) 1 g in dextrose 5 % 50 mL IVPB     1 g 100 mL/hr over 30 Minutes Intravenous Every 24 hours 04/25/16 2249     04/25/16 1700  cefTRIAXone (ROCEPHIN) 1 g in dextrose 5 % 50 mL IVPB     1 g 100 mL/hr over 30 Minutes Intravenous  Once 04/25/16 1654 04/25/16 1814        Intake/Output Summary (Last 24 hours) at 04/26/16 1146 Last data filed at 04/26/16 0912  Gross per 24 hour  Intake           996.67 ml  Output                0 ml  Net           996.67 ml   Filed Weights   04/25/16 1525 04/25/16 2241  Weight: 52.2 kg (115 lb) 48.1 kg (106 lb)    Objective: Physical Exam: Vitals:   04/25/16 1821 04/25/16 2120 04/25/16 2241 04/26/16 0440  BP: 110/77 101/65 107/74 113/71  Pulse: 106 102 (!) 112 98  Resp: 17 16 18 16   Temp: 99 F (37.2 C)  99.7 F (37.6 C) (!) 101.4 F (38.6 C)  TempSrc: Oral  Oral Oral  SpO2: 100% 100% 100% 100%  Weight:   48.1 kg (106 lb)   Height:   5' 3"  (1.6 m)     General: Alert, Awake and Oriented to Time, Place and Person. Appear in moderate distress Eyes: PERRL, Conjunctiva normal ENT: Oral Mucosa clear moist. Neck: no JVD, no Abnormal Mass Or lumps Cardiovascular: S1 and S2 Present, no Murmur, Respiratory: Bilateral Air entry equal and Decreased, Clear to Auscultation, no Crackles, no wheezes Abdomen: Bowel  Sound present, Soft and left flanks tenderness Skin: no redness, no Rash  Extremities: no Pedal edema, no calf tenderness Neurologic: Grossly no focal neuro deficit. Bilaterally Equal motor strength  Data Reviewed: CBC:  Recent Labs Lab 04/25/16 1545 04/26/16 0423  WBC 13.6* 8.9  HGB 13.6 10.0*  HCT 38.0 28.8*  MCV 71.2* 72.0*  PLT 221 301   Basic Metabolic Panel:  Recent Labs Lab 04/25/16 1545 04/26/16 0423  NA 134* 133*  K 3.3* 3.7  CL 101 107  CO2 21* 22  GLUCOSE 98 94  BUN 9 7  CREATININE 0.88 0.79  CALCIUM 9.2 7.4*    Liver Function Tests:  Recent Labs Lab 04/25/16 1545  AST 21  ALT  12*  ALKPHOS 49  BILITOT 1.2  PROT 8.3*  ALBUMIN 4.5    Recent Labs Lab 04/25/16 1545  LIPASE 25   No results for input(s): AMMONIA in the last 168 hours. Coagulation Profile: No results for input(s): INR, PROTIME in the last 168 hours. Cardiac Enzymes: No results for input(s): CKTOTAL, CKMB, CKMBINDEX, TROPONINI in the last 168 hours. BNP (last 3 results) No results for input(s): PROBNP in the last 8760 hours.  CBG: No results for input(s): GLUCAP in the last 168 hours.  Studies: Ct Renal Stone Study  Result Date: 04/26/2016 CLINICAL DATA:  Left flank pain for 3 days. Pyelonephritis. Fever and chills. EXAM: CT ABDOMEN AND PELVIS WITHOUT CONTRAST TECHNIQUE: Multidetector CT imaging of the abdomen and pelvis was performed following the standard protocol without IV contrast. COMPARISON:  None. FINDINGS: Lower chest:  No acute findings. Hepatobiliary: No mass visualized on this un-enhanced exam. Pancreas: No mass or inflammatory process identified on this un-enhanced exam. Spleen: Within normal limits in size. Adrenals/Urinary Tract: No evidence of urolithiasis or hydronephrosis. No evidence of perinephric inflammatory changes or fluid collections. Unopacified urinary bladder is unremarkable in appearance. Stomach/Bowel: Unremarkable. Vascular/Lymphatic: No pathologically enlarged lymph nodes. No evidence of abdominal aortic aneurysm. Reproductive: Uterus is unremarkable in appearance. No pelvic masses identified. Small amount of free fluid seen, which is nonspecific and is likely physiologic in a reproductive age female. Other: None. Musculoskeletal:  No suspicious bone lesions identified. IMPRESSION: No evidence of urolithiasis or hydronephrosis. Small amount of pelvic free fluid, which is nonspecific but most likely physiologic in a reproductive age female. Electronically Signed   By: Earle Gell M.D.   On: 04/26/2016 11:34     Scheduled Meds: . cefTRIAXone (ROCEPHIN)  IV  1 g  Intravenous Q24H  . enoxaparin (LOVENOX) injection  40 mg Subcutaneous QHS  . feeding supplement (ENSURE ENLIVE)  237 mL Oral BID BM   Continuous Infusions:  PRN Meds: acetaminophen **OR** acetaminophen, ketorolac, ondansetron **OR** ondansetron (ZOFRAN) IV, oxyCODONE  Time spent: 30 minutes  Author: Berle Mull, MD [facility] Pager: [phone] 04/26/2016 11:46 AM  If 7PM-7AM, please contact night-coverage at [email or web address], password [email or web address]

## 2016-04-26 NOTE — Progress Notes (Signed)
Initial Nutrition Assessment  INTERVENTION:   Continue Ensure Enlive po BID, each supplement provides 350 kcal and 20 grams of protein Encourage soft, easy to digest foods RD to continue to monitor  NUTRITION DIAGNOSIS:   Inadequate oral intake related to nausea, vomiting as evidenced by per patient/family report.  GOAL:   Patient will meet greater than or equal to 90% of their needs  MONITOR:   PO intake, Supplement acceptance, Labs, Weight trends, I & O's  REASON FOR ASSESSMENT:   Malnutrition Screening Tool    ASSESSMENT:   20 y.o. female with a past medical history significant for mild intermittent asthma who presents with flank pain and chills for 3 days.  Patient in room with family member at bedside asleep. Pt reports continued nausea. She states she did eat some eggs, pancakes and bacon for breakfast this morning but felt like this might come back up. Recommended pt try some softer foods that are easier to digest at lunch like applesauce or toast then try a supplement. RD provided some juice for patient at bedside.  Pt reports UBW of 110 lb. Since symptoms began 3 days ago, she has lost 4 lb.  Nutrition focused physical exam shows no sign of depletion of muscle mass or body fat.  Medications reviewed. Labs reviewed: Low Na  Diet Order:  Diet regular Room service appropriate? Yes; Fluid consistency: Thin  Skin:  Reviewed, no issues  Last BM:  8/28  Height:   Ht Readings from Last 1 Encounters:  04/25/16 5\' 3"  (1.6 m)    Weight:   Wt Readings from Last 1 Encounters:  04/25/16 106 lb (48.1 kg)    Ideal Body Weight:  52.3 kg  BMI:  Body mass index is 18.78 kg/m.  Estimated Nutritional Needs:   Kcal:  1200-1400  Protein:  55-65g  Fluid:  1.5L/day  EDUCATION NEEDS:   Education needs addressed  Tilda FrancoLindsey Shemuel Harkleroad, MS, RD, LDN Pager: [phone] After Hours Pager: [phone]

## 2016-04-27 DIAGNOSIS — A4151 Sepsis due to Escherichia coli [E. coli]: Secondary | ICD-10-CM

## 2016-04-27 LAB — COMPREHENSIVE METABOLIC PANEL
ALK PHOS: 37 U/L — AB (ref 38–126)
ALT: 13 U/L — ABNORMAL LOW (ref 14–54)
ANION GAP: 6 (ref 5–15)
AST: 17 U/L (ref 15–41)
Albumin: 3.6 g/dL (ref 3.5–5.0)
BILIRUBIN TOTAL: 0.4 mg/dL (ref 0.3–1.2)
BUN: 6 mg/dL (ref 6–20)
CALCIUM: 8.8 mg/dL — AB (ref 8.9–10.3)
CO2: 25 mmol/L (ref 22–32)
Chloride: 106 mmol/L (ref 101–111)
Creatinine, Ser: 0.6 mg/dL (ref 0.44–1.00)
Glucose, Bld: 82 mg/dL (ref 65–99)
POTASSIUM: 4 mmol/L (ref 3.5–5.1)
Sodium: 137 mmol/L (ref 135–145)
TOTAL PROTEIN: 6.7 g/dL (ref 6.5–8.1)

## 2016-04-27 LAB — CBC WITH DIFFERENTIAL/PLATELET
BASOS PCT: 0 %
Basophils Absolute: 0 10*3/uL (ref 0.0–0.1)
EOS PCT: 1 %
Eosinophils Absolute: 0.1 10*3/uL (ref 0.0–0.7)
HEMATOCRIT: 30.1 % — AB (ref 36.0–46.0)
HEMOGLOBIN: 10.6 g/dL — AB (ref 12.0–15.0)
LYMPHS ABS: 2.9 10*3/uL (ref 0.7–4.0)
Lymphocytes Relative: 31 %
MCH: 24.8 pg — AB (ref 26.0–34.0)
MCHC: 35.2 g/dL (ref 30.0–36.0)
MCV: 70.3 fL — AB (ref 78.0–100.0)
MONO ABS: 1.1 10*3/uL — AB (ref 0.1–1.0)
Monocytes Relative: 12 %
NEUTROS ABS: 5.1 10*3/uL (ref 1.7–7.7)
Neutrophils Relative %: 56 %
Platelets: 189 10*3/uL (ref 150–400)
RBC: 4.28 MIL/uL (ref 3.87–5.11)
RDW: 13.1 % (ref 11.5–15.5)
WBC: 9.2 10*3/uL (ref 4.0–10.5)

## 2016-04-27 LAB — MAGNESIUM: MAGNESIUM: 1.8 mg/dL (ref 1.7–2.4)

## 2016-04-27 MED ORDER — CYCLOBENZAPRINE HCL 5 MG PO TABS
5.0000 mg | ORAL_TABLET | Freq: Three times a day (TID) | ORAL | Status: DC | PRN
  Administered 2016-04-27 (×2): 5 mg via ORAL
  Filled 2016-04-27 (×2): qty 1

## 2016-04-27 MED ORDER — FERROUS SULFATE 325 (65 FE) MG PO TABS
325.0000 mg | ORAL_TABLET | Freq: Three times a day (TID) | ORAL | Status: DC
  Administered 2016-04-27: 325 mg via ORAL
  Filled 2016-04-27: qty 1

## 2016-04-27 MED ORDER — HYDROCODONE-ACETAMINOPHEN 7.5-325 MG PO TABS
1.0000 | ORAL_TABLET | ORAL | Status: DC | PRN
  Administered 2016-04-27 (×5): 1 via ORAL
  Filled 2016-04-27 (×2): qty 2
  Filled 2016-04-27 (×3): qty 1

## 2016-04-27 MED ORDER — FOLIC ACID 1 MG PO TABS
1.0000 mg | ORAL_TABLET | Freq: Every day | ORAL | Status: DC
  Administered 2016-04-27: 1 mg via ORAL

## 2016-04-27 MED ORDER — VITAMIN B-12 1000 MCG PO TABS
1000.0000 ug | ORAL_TABLET | Freq: Every day | ORAL | Status: DC
  Administered 2016-04-27: 1000 ug via ORAL
  Filled 2016-04-27 (×2): qty 1

## 2016-04-27 MED ORDER — PHENAZOPYRIDINE HCL 100 MG PO TABS
100.0000 mg | ORAL_TABLET | Freq: Three times a day (TID) | ORAL | Status: DC
  Administered 2016-04-27: 100 mg via ORAL
  Filled 2016-04-27 (×4): qty 1

## 2016-04-27 NOTE — Progress Notes (Signed)
Pt c/o pain in her Rt. Flank area now, rates it a 7/10. Lt flank 9/10. Dr Allena KatzPatel notified. Has not voided since 0300.

## 2016-04-27 NOTE — Progress Notes (Signed)
[facility] Progress Note  Patient: Carol Ramos [id number]   PCP: PROVIDER NOT IN SYSTEM DOB: [birth year]   DOA: 04/25/2016   DOS: 04/27/2016   Date of Service: the patient was seen and examined on 04/27/2016  Subjective: Has bilateral flank pain.no more nausea or fever this morning. Burning urination. Nutrition: Tolerating oral diet, minimal oral intake  Brief hospital course: Pt. with no significant PMH; admitted on 04/25/2016, with complaint of nausea with left flank pain, was found to have pyelonephritis.. Currently further plan is continue IV antibiotics.  Assessment and Plan: 1. Sepsis ([place]) With pyelonephritis. From E coli With fever, leukocytosis, lactic acidosis pt met sepsis criteria. Patient has left flank pain. Negative CT renal stone study for any acute obstruction. Continue IV ceftriaxone and continue IV fluids. When necessary Zofran for symptom control.  when necessary Toradol for pain management.  2. Hypokalemia. Resolved. We'll continue to monitor and continue IV fluids with potassium.  3. Anemia. Likely chronic. Probably iron deficiency given low MCV. Will check iron studies.  4. Not pregnant  Pain management: PRN Tylenol, when necessary Toradol Activity: No indication for physical therapy Bowel regimen: last BM 04/25/2016 Diet: Regular diet DVT Prophylaxis: subcutaneous Heparin  Advance goals of care discussion: Full code  Family Communication: family was present at bedside, at the time of interview. The pt provided permission to discuss medical plan with the family. Opportunity was given to ask question and all questions were answered satisfactorily.   Disposition:  Discharge to home. Expected discharge date: 04/28/2016, improvement in sepsis physiology  Consultants: None Procedures: None  Antibiotics: Anti-infectives    Start     Dose/Rate Route Frequency Ordered Stop   04/26/16 1800  cefTRIAXone (ROCEPHIN) 1 g in dextrose 5 % 50 mL IVPB      1 g 100 mL/hr over 30 Minutes Intravenous Every 24 hours 04/25/16 2249     04/25/16 1700  cefTRIAXone (ROCEPHIN) 1 g in dextrose 5 % 50 mL IVPB     1 g 100 mL/hr over 30 Minutes Intravenous  Once 04/25/16 1654 04/25/16 1814        Intake/Output Summary (Last 24 hours) at 04/27/16 1602 Last data filed at 04/27/16 1200  Gross per 24 hour  Intake             1065 ml  Output              500 ml  Net              565 ml   Filed Weights   04/25/16 1525 04/25/16 2241  Weight: 52.2 kg (115 lb) 48.1 kg (106 lb)    Objective: Physical Exam: Vitals:   04/26/16 1831 04/26/16 2117 04/27/16 0502 04/27/16 1302  BP:  102/81 109/79 90/61  Pulse:  63 85 (!) 107  Resp:  16 16 16   Temp: 97.9 F (36.6 C) 98.5 F (36.9 C) 98.4 F (36.9 C) 98.6 F (37 C)  TempSrc: Oral Oral Oral Oral  SpO2:  100% 100% 98%  Weight:      Height:        General: Alert, Awake and Oriented to Time, Place and Person. Appear in moderate distress Eyes: PERRL, Conjunctiva normal ENT: Oral Mucosa clear moist. Neck: no JVD, no Abnormal Mass Or lumps Cardiovascular: S1 and S2 Present, no Murmur, Respiratory: Bilateral Air entry equal and Decreased, Clear to Auscultation, no Crackles, no wheezes Abdomen: Bowel Sound present, Soft and left flanks tenderness Skin: no redness, no Rash  Extremities: no Pedal edema, no calf tenderness Neurologic: Grossly no focal neuro deficit. Bilaterally Equal motor strength  Data Reviewed: CBC:  Recent Labs Lab 04/25/16 1545 04/26/16 0423 04/27/16 0514  WBC 13.6* 8.9 9.2  NEUTROABS  --   --  5.1  HGB 13.6 10.0* 10.6*  HCT 38.0 28.8* 30.1*  MCV 71.2* 72.0* 70.3*  PLT 221 171 742   Basic Metabolic Panel:  Recent Labs Lab 04/25/16 1545 04/26/16 0423 04/27/16 0514  NA 134* 133* 137  K 3.3* 3.7 4.0  CL 101 107 106  CO2 21* 22 25  GLUCOSE 98 94 82  BUN 9 7 6   CREATININE 0.88 0.79 0.60  CALCIUM 9.2 7.4* 8.8*  MG  --   --  1.8    Liver Function  Tests:  Recent Labs Lab 04/25/16 1545 04/27/16 0514  AST 21 17  ALT 12* 13*  ALKPHOS 49 37*  BILITOT 1.2 0.4  PROT 8.3* 6.7  ALBUMIN 4.5 3.6    Recent Labs Lab 04/25/16 1545  LIPASE 25   No results for input(s): AMMONIA in the last 168 hours. Coagulation Profile: No results for input(s): INR, PROTIME in the last 168 hours. Cardiac Enzymes: No results for input(s): CKTOTAL, CKMB, CKMBINDEX, TROPONINI in the last 168 hours. BNP (last 3 results) No results for input(s): PROBNP in the last 8760 hours.  CBG: No results for input(s): GLUCAP in the last 168 hours.  Studies: No results found.   Scheduled Meds: . cefTRIAXone (ROCEPHIN)  IV  1 g Intravenous Q24H  . enoxaparin (LOVENOX) injection  40 mg Subcutaneous QHS  . feeding supplement (ENSURE ENLIVE)  237 mL Oral BID BM  . ketorolac  30 mg Intravenous Q6H  . phenazopyridine  100 mg Oral TID WC   Continuous Infusions:  PRN Meds: acetaminophen **OR** acetaminophen, cyclobenzaprine, HYDROcodone-acetaminophen, ondansetron **OR** ondansetron (ZOFRAN) IV  Time spent: 30 minutes  Author: Berle Mull, MD [facility] Pager: [phone] 04/27/2016 4:02 PM  If 7PM-7AM, please contact night-coverage at [email or web address], password [facility]

## 2016-04-28 LAB — FOLATE: FOLATE: 23.1 ng/mL (ref >5.9)

## 2016-04-28 LAB — IRON AND TIBC
Iron: 17 ug/dL — ABNORMAL LOW (ref 28–170)
Saturation Ratios: 8 % — ABNORMAL LOW (ref 10.4–31.8)
TIBC: 203 ug/dL — ABNORMAL LOW (ref 250–450)
UIBC: 186 ug/dL

## 2016-04-28 LAB — URINE CULTURE: Culture: >=100000 — AB

## 2016-04-28 LAB — GC/CHLAMYDIA PROBE AMP (~~LOC~~) NOT AT ARMC
CHLAMYDIA, DNA PROBE: NEGATIVE
NEISSERIA GONORRHEA: NEGATIVE

## 2016-04-28 LAB — FERRITIN: Ferritin: 133 ng/mL (ref 11–307)

## 2016-04-28 LAB — VITAMIN B12: VITAMIN B 12: 1103 pg/mL — AB (ref 180–914)

## 2016-04-28 MED ORDER — HYDROCODONE-ACETAMINOPHEN 5-325 MG PO TABS: 1.0000 | ORAL_TABLET | Freq: Four times a day (QID) | ORAL | 0 refills | Status: DC | PRN

## 2016-04-28 MED ORDER — CEPHALEXIN 500 MG PO CAPS
500.0000 mg | ORAL_CAPSULE | Freq: Two times a day (BID) | ORAL | Status: DC
  Administered 2016-04-28: 500 mg via ORAL
  Filled 2016-04-28: qty 1

## 2016-04-28 MED ORDER — CEPHALEXIN 500 MG PO CAPS: 500.0000 mg | ORAL_CAPSULE | Freq: Two times a day (BID) | ORAL | 0 refills | Status: AC

## 2016-04-28 MED ORDER — ENSURE ENLIVE PO LIQD: 237.0000 mL | Freq: Two times a day (BID) | ORAL | 12 refills | Status: DC

## 2016-04-28 MED ORDER — FERROUS SULFATE 325 (65 FE) MG PO TABS: 325.0000 mg | ORAL_TABLET | Freq: Three times a day (TID) | ORAL | 0 refills | Status: DC

## 2016-04-28 MED ORDER — CYCLOBENZAPRINE HCL 5 MG PO TABS: 5.0000 mg | ORAL_TABLET | Freq: Three times a day (TID) | ORAL | 0 refills | Status: DC | PRN

## 2016-04-28 MED ORDER — PHENAZOPYRIDINE HCL 100 MG PO TABS: 100.0000 mg | ORAL_TABLET | Freq: Three times a day (TID) | ORAL | 0 refills | Status: DC

## 2016-04-28 NOTE — Discharge Summary (Signed)
[facility] Discharge Summary   Patient: Carol Ramos [id number]   PCP: PROVIDER NOT IN SYSTEM DOB: 1996/01/11   Date of admission: 04/25/2016   Date of discharge: 04/28/2016     Discharge Diagnoses:  Principal Problem:   Sepsis ([place]) Active Problems:   Pyelonephritis   Hyponatremia   Hypokalemia   Mild intermittent asthma   Admitted From: hom Disposition:  home  Recommendations for Outpatient Follow-up:  1. Please establish care with PCP and follow up in 1 week.   Follow-up Information    PCP. Schedule an appointment as soon as possible for a visit in 1 week(s).          Diet recommendation: regular diet  Activity: The patient is advised to gradually reintroduce usual activities.  Discharge Condition: good  Code Status: full code  History of present illness: As per the H and P dictated on admission, "ARMIDA Ramos is a 20 y.o. female with a past medical history significant for mild intermittent asthma who presents with flank pain and chills for 3 days.  3 days ago the patient had gradual onset left flank pain, achy in character, sore. Over the next 2 days this developed into an intermittent fever and chills, sweats, myalgias, and vomiting. She also noted foul-smelling urine and urinary hesitancy. By today she had had worsening fever chills myalgias, and hadn't been able to keep anything down for 2 days so she came to the ER."  Hospital Course:  Summary of her active problems in the hospital is as following. 1. Sepsis ([place]) With pyelonephritis. From E coli With fever, leukocytosis, lactic acidosis pt met sepsis criteria. Patient has left flank pain. Negative CT renal stone study for any acute obstruction. Continue pain management at home. Changing antibiotics to oral keflex.  2. Hypokalemia. Resolved. We'll continue to monitor and continue IV fluids with potassium.  3. Anemia. Likely chronic. Iron low. Replace    All other chronic medical  condition were stable during the hospitalization.  Patient was ambulatory without any assistance. On the day of the discharge the patient's vitals were stable, and no other acute medical condition were reported by patient. the patient was felt safe to be discharge at home with family.  Procedures and Results:  none   Consultations:  none  DISCHARGE MEDICATION: Discharge Medication List as of 04/28/2016  1:24 PM    START taking these medications   Details  cephALEXin (KEFLEX) 500 MG capsule Take 1 capsule (500 mg total) by mouth every 12 (twelve) hours., Starting Tue 04/28/2016, Until Sat 05/02/2016, Normal    cyclobenzaprine (FLEXERIL) 5 MG tablet Take 1 tablet (5 mg total) by mouth 3 (three) times daily as needed for muscle spasms (flank pain)., Starting Tue 04/28/2016, Normal    feeding supplement, ENSURE ENLIVE, (ENSURE ENLIVE) LIQD Take 237 mLs by mouth 2 (two) times daily between meals., Starting Tue 04/28/2016, Normal    ferrous sulfate 325 (65 FE) MG tablet Take 1 tablet (325 mg total) by mouth 3 (three) times daily with meals., Starting Tue 04/28/2016, Normal    HYDROcodone-acetaminophen (NORCO) 5-325 MG tablet Take 1 tablet by mouth every 6 (six) hours as needed for moderate pain., Starting Tue 04/28/2016, Print    phenazopyridine (PYRIDIUM) 100 MG tablet Take 1 tablet (100 mg total) by mouth 3 (three) times daily with meals., Starting Tue 04/28/2016, Normal      CONTINUE these medications which have NOT CHANGED   Details  acetaminophen (TYLENOL) 325 MG tablet Take 650 mg  by mouth every 6 (six) hours as needed for fever or headache., Historical Med    albuterol (PROVENTIL HFA;VENTOLIN HFA) 108 (90 BASE) MCG/ACT inhaler Inhale 2 puffs into the lungs every 4 (four) hours as needed for wheezing or shortness of breath., Until Discontinued, Historical Med       Allergies  Allergen Reactions  . Peanut-Containing Drug Products Other (See Comments)    Difficulty breathing, nose itching    Discharge Instructions    Diet general    Complete by:  As directed   Discharge instructions    Complete by:  As directed   It is important that you read following instructions as well as go over your medication list with RN to help you understand your care after this hospitalization.  Discharge Instructions: Please follow-up with PCP in one week  Please request your primary care physician to go over all Hospital Tests and Procedure/Radiological results at the follow up,  Please get all Hospital records sent to your PCP by signing hospital release before you go home.   Do not drive, operating heavy machinery, perform activities at heights, swimming or participation in water activities or provide baby sitting services while your are on Pain, Sleep and Anxiety Medications; until you have been seen by Primary Care Physician or a Neurologist and advised to do so again. Do not take more than prescribed Pain, Sleep and Anxiety Medications. You were cared for by a hospitalist during your hospital stay. If you have any questions about your discharge medications or the care you received while you were in the hospital after you are discharged, you can call the unit and ask to speak with the hospitalist on call if the hospitalist that took care of you is not available.  Once you are discharged, your primary care physician will handle any further medical issues. Please note that NO REFILLS for any discharge medications will be authorized once you are discharged, as it is imperative that you return to your primary care physician (or establish a relationship with a primary care physician if you do not have one) for your aftercare needs so that they can reassess your need for medications and monitor your lab values. You Must read complete instructions/literature along with all the possible adverse reactions/side effects for all the Medicines you take and that have been prescribed to you. Take any new Medicines  after you have completely understood and accept all the possible adverse reactions/side effects. Wear Seat belts while driving. If you have smoked or chewed Tobacco in the last 2 yrs please stop smoking and/or stop any Recreational drug use.   Increase activity slowly    Complete by:  As directed     Discharge Exam: Filed Weights   04/25/16 1525 04/25/16 2241  Weight: 52.2 kg (115 lb) 48.1 kg (106 lb)   Vitals:   04/28/16 0450 04/28/16 1349  BP: 102/64 (!) 101/57  Pulse: 67 (!) 103  Resp: 16 16  Temp: 99 F (37.2 C) 99.4 F (37.4 C)   General: Appear in no distress, no Rash; Oral Mucosa moist. Cardiovascular: S1 and S2 Present, no Murmur, no JVD Respiratory: Bilateral Air entry present and Clear to Auscultation, no Crackles, no wheezes Abdomen: Bowel Sound present, Soft and no tenderness Extremities: no Pedal edema, no calf tenderness Neurology: Grossly no focal neuro deficit.  The results of significant diagnostics from this hospitalization (including imaging, microbiology, ancillary and laboratory) are listed below for reference.    Significant Diagnostic Studies: Ct Renal  Stone Study  Result Date: 04/26/2016 CLINICAL DATA:  Left flank pain for 3 days. Pyelonephritis. Fever and chills. EXAM: CT ABDOMEN AND PELVIS WITHOUT CONTRAST TECHNIQUE: Multidetector CT imaging of the abdomen and pelvis was performed following the standard protocol without IV contrast. COMPARISON:  None. FINDINGS: Lower chest:  No acute findings. Hepatobiliary: No mass visualized on this un-enhanced exam. Pancreas: No mass or inflammatory process identified on this un-enhanced exam. Spleen: Within normal limits in size. Adrenals/Urinary Tract: No evidence of urolithiasis or hydronephrosis. No evidence of perinephric inflammatory changes or fluid collections. Unopacified urinary bladder is unremarkable in appearance. Stomach/Bowel: Unremarkable. Vascular/Lymphatic: No pathologically enlarged lymph nodes. No  evidence of abdominal aortic aneurysm. Reproductive: Uterus is unremarkable in appearance. No pelvic masses identified. Small amount of free fluid seen, which is nonspecific and is likely physiologic in a reproductive age female. Other: None. Musculoskeletal:  No suspicious bone lesions identified. IMPRESSION: No evidence of urolithiasis or hydronephrosis. Small amount of pelvic free fluid, which is nonspecific but most likely physiologic in a reproductive age female. Electronically Signed   By: Earle Gell M.D.   On: 04/26/2016 11:34    Microbiology: Recent Results (from the past 240 hour(s))  Urine culture     Status: Abnormal   Collection Time: 04/25/16  3:29 PM  Result Value Ref Range Status   Specimen Description URINE, RANDOM  Final   Special Requests NONE  Final   Culture >=100,000 COLONIES/mL ESCHERICHIA COLI (A)  Final   Report Status 04/28/2016 FINAL  Final   Organism ID, Bacteria ESCHERICHIA COLI (A)  Final      Susceptibility   Escherichia coli - MIC*    AMPICILLIN <=2 SENSITIVE Sensitive     CEFAZOLIN <=4 SENSITIVE Sensitive     CEFTRIAXONE <=1 SENSITIVE Sensitive     CIPROFLOXACIN <=0.25 SENSITIVE Sensitive     GENTAMICIN <=1 SENSITIVE Sensitive     IMIPENEM <=0.25 SENSITIVE Sensitive     NITROFURANTOIN <=16 SENSITIVE Sensitive     TRIMETH/SULFA <=20 SENSITIVE Sensitive     AMPICILLIN/SULBACTAM <=2 SENSITIVE Sensitive     PIP/TAZO <=4 SENSITIVE Sensitive     Extended ESBL NEGATIVE Sensitive     * >=100,000 COLONIES/mL ESCHERICHIA COLI  Culture, blood (Routine X 2) w Reflex to ID Panel     Status: None (Preliminary result)   Collection Time: 04/25/16  5:12 PM  Result Value Ref Range Status   Specimen Description BLOOD LEFT ANTECUBITAL  Final   Special Requests BOTTLES DRAWN AEROBIC AND ANAEROBIC 5CC  Final   Culture   Final    NO GROWTH 3 DAYS Performed at [facility]    Report Status PENDING  Incomplete  Culture, blood (Routine X 2) w Reflex to ID Panel      Status: None (Preliminary result)   Collection Time: 04/25/16  5:15 PM  Result Value Ref Range Status   Specimen Description BLOOD RIGHT ANTECUBITAL  Final   Special Requests BOTTLES DRAWN AEROBIC AND ANAEROBIC 5CC  Final   Culture   Final    NO GROWTH 3 DAYS Performed at [facility]    Report Status PENDING  Incomplete  Wet prep, genital     Status: Abnormal   Collection Time: 04/25/16  5:50 PM  Result Value Ref Range Status   Yeast Wet Prep HPF POC NONE SEEN NONE SEEN Final   Trich, Wet Prep NONE SEEN NONE SEEN Final   Clue Cells Wet Prep HPF POC PRESENT (A) NONE SEEN Final  WBC, Wet Prep HPF POC MANY (A) NONE SEEN Final   Sperm NONE SEEN  Final     Labs: CBC:  Recent Labs Lab 04/25/16 1545 04/26/16 0423 04/27/16 0514  WBC 13.6* 8.9 9.2  NEUTROABS  --   --  5.1  HGB 13.6 10.0* 10.6*  HCT 38.0 28.8* 30.1*  MCV 71.2* 72.0* 70.3*  PLT 221 171 170   Basic Metabolic Panel:  Recent Labs Lab 04/25/16 1545 04/26/16 0423 04/27/16 0514  NA 134* 133* 137  K 3.3* 3.7 4.0  CL 101 107 106  CO2 21* 22 25  GLUCOSE 98 94 82  BUN _0 CREATININE 0.88 0.79 0.60  CALCIUM 9.2 7.4* 8.8*  MG  --   --  1.8   Liver Function Tests:  Recent Labs Lab 04/25/16 1545 04/27/16 0514  AST 21 17  ALT 12* 13*  ALKPHOS 49 37*  BILITOT 1.2 0.4  PROT 8.3* 6.7  ALBUMIN 4.5 3.6    Recent Labs Lab 04/25/16 1545  LIPASE 25   No results for input(s): AMMONIA in the last 168 hours. Cardiac Enzymes: No results for input(s): CKTOTAL, CKMB, CKMBINDEX, TROPONINI in the last 168 hours. BNP (last 3 results) No results for input(s): BNP in the last 8760 hours. CBG: No results for input(s): GLUCAP in the last 168 hours. Time spent: 30 minutes  Signed:  Careem Yasui  [facility] 04/28/2016   , 11:09 PM

## 2016-04-30 LAB — CULTURE, BLOOD (ROUTINE X 2)
CULTURE: NO GROWTH
Culture: NO GROWTH

## 2016-11-03 ENCOUNTER — Encounter (HOSPITAL_COMMUNITY): Payer: Self-pay | Admitting: Emergency Medicine

## 2016-11-03 ENCOUNTER — Emergency Department (HOSPITAL_COMMUNITY)
Admission: EM | Admit: 2016-11-03 | Discharge: 2016-11-04 | Disposition: A | Discharge status: Home / Self Care | Attending: Emergency Medicine | Admitting: Emergency Medicine

## 2016-11-03 DIAGNOSIS — Z91010 Allergy to peanuts: Secondary | ICD-10-CM | POA: Insufficient documentation

## 2016-11-03 DIAGNOSIS — J4521 Mild intermittent asthma with (acute) exacerbation: Secondary | ICD-10-CM | POA: Insufficient documentation

## 2016-11-03 DIAGNOSIS — Z79899 Other long term (current) drug therapy: Secondary | ICD-10-CM | POA: Insufficient documentation

## 2016-11-03 MED ORDER — PREDNISONE 20 MG PO TABS
60.0000 mg | ORAL_TABLET | Freq: Once | ORAL | Status: AC
  Administered 2016-11-03: 60 mg via ORAL
  Filled 2016-11-03: qty 3

## 2016-11-03 MED ORDER — IPRATROPIUM BROMIDE 0.02 % IN SOLN
0.5000 mg | Freq: Once | RESPIRATORY_TRACT | Status: AC
  Administered 2016-11-03: 0.5 mg via RESPIRATORY_TRACT
  Filled 2016-11-03: qty 2.5

## 2016-11-03 MED ORDER — ALBUTEROL SULFATE (2.5 MG/3ML) 0.083% IN NEBU
5.0000 mg | INHALATION_SOLUTION | Freq: Once | RESPIRATORY_TRACT | Status: AC
  Administered 2016-11-03: 5 mg via RESPIRATORY_TRACT
  Filled 2016-11-03: qty 6

## 2016-11-03 NOTE — ED Triage Notes (Signed)
Pt states that since yesterday morning she has had wheezing associated with her asthma. No inhaler/nebs at home. Hx of asthma attacks but none recently. Alert and oriented.

## 2016-11-03 NOTE — ED Notes (Signed)
Pt reports feeling better after initial breathing treatment.

## 2016-11-04 MED ORDER — ALBUTEROL SULFATE HFA 108 (90 BASE) MCG/ACT IN AERS
2.0000 | INHALATION_SPRAY | Freq: Once | RESPIRATORY_TRACT | Status: AC
  Administered 2016-11-04: 2 via RESPIRATORY_TRACT
  Filled 2016-11-04: qty 6.7

## 2016-11-04 MED ORDER — ALBUTEROL SULFATE (2.5 MG/3ML) 0.083% IN NEBU: 2.5000 mg | INHALATION_SOLUTION | Freq: Four times a day (QID) | RESPIRATORY_TRACT | 1 refills | Status: AC | PRN

## 2016-11-04 MED ORDER — ALBUTEROL SULFATE HFA 108 (90 BASE) MCG/ACT IN AERS: 1.0000 | INHALATION_SPRAY | Freq: Four times a day (QID) | RESPIRATORY_TRACT | 0 refills | Status: AC | PRN

## 2016-11-04 MED ORDER — PREDNISONE 20 MG PO TABS: 40.0000 mg | ORAL_TABLET | Freq: Every day | ORAL | 0 refills | Status: DC

## 2016-11-04 NOTE — ED Provider Notes (Signed)
[facility]-EMERGENCY DEPT Provider Note   CSN: [id number] Arrival date & time: 11/03/16  2118     History   Chief Complaint Chief Complaint  Patient presents with  . Asthma    HPI Carol Ramos is a 21 y.o. female.  Carol Ramos is a 21 y.o. Female with a history of asthma who presents to the emergency department complaining of an asthma exacerbation since yesterday. Patient with history of asthma and usually does not require her albuterol inhaler. She reports her asthma began to act up yesterday and she feels her chest is tight, she's had wheezing and shortness of breath. She also reports some slight coughing. She denies fevers, sore throat, abdominal pain, hemoptysis, or rashes.   The history is provided by the patient, a relative and medical records. No language interpreter was used.  Asthma  Associated symptoms include shortness of breath. Pertinent negatives include no chest pain.    Past Medical History:  Diagnosis Date  . Allergy   . Asthma     Patient Active Problem List   Diagnosis Date Noted  . Pyelonephritis 04/25/2016  . Sepsis (HCC) 04/25/2016  . Hyponatremia 04/25/2016  . Hypokalemia 04/25/2016  . Mild intermittent asthma 04/25/2016    Past Surgical History:  Procedure Laterality Date  . KNEE SURGERY      OB History    No data available       Home Medications    Prior to Admission medications   Medication Sig Start Date End Date Taking? Authorizing Provider  acetaminophen (TYLENOL) 325 MG tablet Take 650 mg by mouth every 6 (six) hours as needed for fever or headache.    Historical Provider, MD  albuterol (PROVENTIL HFA;VENTOLIN HFA) 108 (90 Base) MCG/ACT inhaler Inhale 1-2 puffs into the lungs every 6 (six) hours as needed for wheezing or shortness of breath. 11/04/16   Everlene FarrierWilliam Amogh Komatsu, PA-C  albuterol (PROVENTIL) (2.5 MG/3ML) 0.083% nebulizer solution Take 3 mLs (2.5 mg total) by nebulization every 6 (six) hours as needed for wheezing or  shortness of breath. 11/04/16   Everlene FarrierWilliam Abdel Effinger, PA-C  cyclobenzaprine (FLEXERIL) 5 MG tablet Take 1 tablet (5 mg total) by mouth 3 (three) times daily as needed for muscle spasms (flank pain). 04/28/16   Rolly SalterPranav M Patel, MD  feeding supplement, ENSURE ENLIVE, (ENSURE ENLIVE) LIQD Take 237 mLs by mouth 2 (two) times daily between meals. 04/28/16   Rolly SalterPranav M Patel, MD  ferrous sulfate 325 (65 FE) MG tablet Take 1 tablet (325 mg total) by mouth 3 (three) times daily with meals. 04/28/16   Rolly SalterPranav M Patel, MD  HYDROcodone-acetaminophen (NORCO) 5-325 MG tablet Take 1 tablet by mouth every 6 (six) hours as needed for moderate pain. 04/28/16   Rolly SalterPranav M Patel, MD  phenazopyridine (PYRIDIUM) 100 MG tablet Take 1 tablet (100 mg total) by mouth 3 (three) times daily with meals. 04/28/16   Rolly SalterPranav M Patel, MD  predniSONE (DELTASONE) 20 MG tablet Take 2 tablets (40 mg total) by mouth daily. 11/04/16   Everlene FarrierWilliam Micalah Cabezas, PA-C    Family History Family History  Problem Relation Age of Onset  . Asthma Mother     Social History Social History  Substance Use Topics  . Smoking status: Never Smoker  . Smokeless tobacco: Never Used  . Alcohol use No     Allergies   Peanut-containing drug products   Review of Systems Review of Systems  Constitutional: Negative for fever.  Respiratory: Positive for cough, chest tightness, shortness of breath  and wheezing.   Cardiovascular: Negative for chest pain.  Skin: Negative for rash.  Neurological: Negative for syncope and light-headedness.     Physical Exam Updated Vital Signs BP 104/57 (BP Location: Right Arm)   Pulse 111   Temp 98 F (36.7 C) (Oral)   Resp 17   Ht 5\' 3"  (1.6 m)   Wt 47.6 kg   LMP 10/27/2016 (Approximate)   SpO2 98%   BMI 18.60 kg/m   Physical Exam  Constitutional: She appears well-developed and well-nourished. No distress.  Nontoxic appearing.  HENT:  Head: Normocephalic and atraumatic.  Right Ear: External ear normal.  Left Ear: External ear  normal.  Mouth/Throat: Oropharynx is clear and moist.  Eyes: Conjunctivae are normal. Pupils are equal, round, and reactive to light. Right eye exhibits no discharge. Left eye exhibits no discharge.  Neck: Neck supple.  Cardiovascular: Normal rate, regular rhythm, normal heart sounds and intact distal pulses.   Pulmonary/Chest: Effort normal. No respiratory distress. She has wheezes. She has no rales.  Inspiratory and expiratory wheezes noted bilaterally. Patient speaking in complete sentences. No increased work of breathing. Oxygen saturation 97% on room air.  Abdominal: Soft. There is no tenderness.  Musculoskeletal: She exhibits no tenderness.  Lymphadenopathy:    She has no cervical adenopathy.  Neurological: She is alert. Coordination normal.  Skin: Skin is warm and dry. Capillary refill takes less than 2 seconds. No rash noted. She is not diaphoretic. No erythema. No pallor.  Psychiatric: She has a normal mood and affect. Her behavior is normal.  Nursing note and vitals reviewed.    ED Treatments / Results  Labs (all labs ordered are listed, but only abnormal results are displayed) Labs Reviewed - No data to display  EKG  EKG Interpretation None       Radiology No results found.  Procedures Procedures (including critical care time)  Medications Ordered in ED Medications  albuterol (PROVENTIL) (2.5 MG/3ML) 0.083% nebulizer solution 5 mg (5 mg Nebulization Given 11/03/16 2123)  albuterol (PROVENTIL) (2.5 MG/3ML) 0.083% nebulizer solution 5 mg (5 mg Nebulization Given 11/03/16 2321)  ipratropium (ATROVENT) nebulizer solution 0.5 mg (0.5 mg Nebulization Given 11/03/16 2321)  predniSONE (DELTASONE) tablet 60 mg (60 mg Oral Given 11/03/16 2320)  albuterol (PROVENTIL HFA;VENTOLIN HFA) 108 (90 Base) MCG/ACT inhaler 2 puff (2 puffs Inhalation Given 11/04/16 0042)     Initial Impression / Assessment and Plan / ED Course  I have reviewed the triage vital signs and the nursing  notes.  Pertinent labs & imaging results that were available during my care of the patient were reviewed by me and considered in my medical decision making (see chart for details).    Patient presents with asthma exacerbation since yesterday. She denies fevers. On exam patient is afebrile nontoxic appearing. She has diffuse wheezing noted bilaterally. After second breathing treatment patient reports feeling much better. She reports her shortness of breath has resolved. On repeat lung exam patient's wheezing has resolved. Patient received 60 mg of prednisone in the emergency department. Will discharge her with a 5 day course of prednisone. I provided her with an albuterol inhaler at discharge and prescriptions for refills for an albuterol inhaler and nebulization treatments. I encouraged her to follow-up with primary care to discuss maintenance inhalers. I discussed return precautions. I advised the patient to follow-up with their primary care provider this week. I advised the patient to return to the emergency department with new or worsening symptoms or new concerns. The patient  verbalized understanding and agreement with plan.      Final Clinical Impressions(s) / ED Diagnoses   Final diagnoses:  Mild intermittent asthma with exacerbation    New Prescriptions Discharge Medication List as of 11/04/2016 12:27 AM    START taking these medications   Details  albuterol (PROVENTIL) (2.5 MG/3ML) 0.083% nebulizer solution Take 3 mLs (2.5 mg total) by nebulization every 6 (six) hours as needed for wheezing or shortness of breath., Starting Wed 11/04/2016, Print    predniSONE (DELTASONE) 20 MG tablet Take 2 tablets (40 mg total) by mouth daily., Starting Wed 11/04/2016, Print         Everlene Farrier, PA-C 11/04/16 0157    Shaune Pollack, MD 11/04/16 1147

## 2017-04-30 ENCOUNTER — Emergency Department (HOSPITAL_COMMUNITY)
Admission: EM | Admit: 2017-04-30 | Discharge: 2017-04-30 | Disposition: A | Discharge status: Home / Self Care | Attending: Emergency Medicine | Admitting: Emergency Medicine

## 2017-04-30 ENCOUNTER — Encounter (HOSPITAL_COMMUNITY): Payer: Self-pay

## 2017-04-30 ENCOUNTER — Emergency Department (HOSPITAL_COMMUNITY)

## 2017-04-30 DIAGNOSIS — R1010 Upper abdominal pain, unspecified: Secondary | ICD-10-CM | POA: Insufficient documentation

## 2017-04-30 DIAGNOSIS — R079 Chest pain, unspecified: Secondary | ICD-10-CM | POA: Insufficient documentation

## 2017-04-30 DIAGNOSIS — J4521 Mild intermittent asthma with (acute) exacerbation: Secondary | ICD-10-CM | POA: Insufficient documentation

## 2017-04-30 DIAGNOSIS — Z91010 Allergy to peanuts: Secondary | ICD-10-CM | POA: Insufficient documentation

## 2017-04-30 DIAGNOSIS — F17210 Nicotine dependence, cigarettes, uncomplicated: Secondary | ICD-10-CM | POA: Insufficient documentation

## 2017-04-30 LAB — COMPREHENSIVE METABOLIC PANEL
ALBUMIN: 4.2 g/dL (ref 3.5–5.0)
ALT: 11 U/L — ABNORMAL LOW (ref 14–54)
ANION GAP: 6 (ref 5–15)
AST: 17 U/L (ref 15–41)
Alkaline Phosphatase: 50 U/L (ref 38–126)
BUN: 8 mg/dL (ref 6–20)
CHLORIDE: 107 mmol/L (ref 101–111)
CO2: 25 mmol/L (ref 22–32)
Calcium: 8.9 mg/dL (ref 8.9–10.3)
Creatinine, Ser: 0.71 mg/dL (ref 0.44–1.00)
GFR calc Af Amer: >60 mL/min (ref >60)
GFR calc non Af Amer: >60 mL/min (ref >60)
GLUCOSE: 87 mg/dL (ref 65–99)
POTASSIUM: 3.6 mmol/L (ref 3.5–5.1)
SODIUM: 138 mmol/L (ref 135–145)
Total Bilirubin: 0.8 mg/dL (ref 0.3–1.2)
Total Protein: 7 g/dL (ref 6.5–8.1)

## 2017-04-30 LAB — CBC WITH DIFFERENTIAL/PLATELET
BASOS ABS: 0 10*3/uL (ref 0.0–0.1)
Basophils Relative: 0 %
Eosinophils Absolute: 0.2 10*3/uL (ref 0.0–0.7)
Eosinophils Relative: 3 %
HEMATOCRIT: 36.3 % (ref 36.0–46.0)
HEMOGLOBIN: 12.7 g/dL (ref 12.0–15.0)
LYMPHS ABS: 1.2 10*3/uL (ref 0.7–4.0)
LYMPHS PCT: 20 %
MCH: 25.2 pg — ABNORMAL LOW (ref 26.0–34.0)
MCHC: 35 g/dL (ref 30.0–36.0)
MCV: 72.2 fL — ABNORMAL LOW (ref 78.0–100.0)
MONOS PCT: 12 %
Monocytes Absolute: 0.7 10*3/uL (ref 0.1–1.0)
NEUTROS ABS: 3.8 10*3/uL (ref 1.7–7.7)
Neutrophils Relative %: 65 %
Platelets: 311 10*3/uL (ref 150–400)
RBC: 5.03 MIL/uL (ref 3.87–5.11)
RDW: 13.6 % (ref 11.5–15.5)
WBC: 5.9 10*3/uL (ref 4.0–10.5)

## 2017-04-30 LAB — URINALYSIS, ROUTINE W REFLEX MICROSCOPIC
BILIRUBIN URINE: NEGATIVE
GLUCOSE, UA: NEGATIVE mg/dL
KETONES UR: NEGATIVE mg/dL
NITRITE: NEGATIVE
PROTEIN: NEGATIVE mg/dL
Specific Gravity, Urine: 1.008 (ref 1.005–1.030)
pH: 6 (ref 5.0–8.0)

## 2017-04-30 LAB — POC URINE PREG, ED: Preg Test, Ur: NEGATIVE

## 2017-04-30 MED ORDER — PREDNISONE 20 MG PO TABS
40.0000 mg | ORAL_TABLET | Freq: Once | ORAL | Status: AC
  Administered 2017-04-30: 40 mg via ORAL
  Filled 2017-04-30: qty 2

## 2017-04-30 MED ORDER — ALBUTEROL SULFATE HFA 108 (90 BASE) MCG/ACT IN AERS
2.0000 | INHALATION_SPRAY | Freq: Once | RESPIRATORY_TRACT | Status: AC
  Administered 2017-04-30: 2 via RESPIRATORY_TRACT
  Filled 2017-04-30: qty 6.7

## 2017-04-30 MED ORDER — IPRATROPIUM-ALBUTEROL 0.5-2.5 (3) MG/3ML IN SOLN
3.0000 mL | Freq: Once | RESPIRATORY_TRACT | Status: AC
  Administered 2017-04-30: 3 mL via RESPIRATORY_TRACT
  Filled 2017-04-30: qty 3

## 2017-04-30 MED ORDER — PREDNISONE 10 MG PO TABS: 40.0000 mg | ORAL_TABLET | Freq: Every day | ORAL | 0 refills | Status: AC

## 2017-04-30 NOTE — ED Triage Notes (Signed)
Patient states she has been having left chest pain x a few weeks. Patient c/o soreness when left chest is touched and increased pain when taking a deep breath. Patient has a non productive cough. Patient states she has been taking robitussin and feels like congestion is starting to break up.

## 2017-04-30 NOTE — ED Provider Notes (Signed)
[facility]-EMERGENCY DEPT Provider Note   CSN: [id number] Arrival date & time: 04/30/17  [id number]     History   Chief Complaint Chief Complaint  Patient presents with  . Chest Pain  . Cough    HPI Carol Ramos is a 21 y.o. female.  The history is provided by the patient. No language interpreter was used.  Chest Pain   Associated symptoms include cough.  Cough  Associated symptoms include chest pain.   Carol Ramos is a 21 y.o. female who presents to the Emergency Department complaining of side pain. She reports 3 weeks of left side pain. Pain is located in the lower aspect of her chest and upper abdomen. She has a history of chronic pain but it has been worse recently. She reports associated shortness of breath with cough. She also has dysuria. She denies any fevers, nausea, vomiting. The pain is sharp and sticking in nature and worse with deep breaths. I give her ago she had similar symptoms and was admitted to the hospital with a kidney infection. She does not take any medications. Past Medical History:  Diagnosis Date  . Allergy   . Asthma     Patient Active Problem List   Diagnosis Date Noted  . Pyelonephritis 04/25/2016  . Sepsis (HCC) 04/25/2016  . Hyponatremia 04/25/2016  . Hypokalemia 04/25/2016  . Mild intermittent asthma 04/25/2016    Past Surgical History:  Procedure Laterality Date  . KNEE SURGERY      OB History    No data available       Home Medications    Prior to Admission medications   Medication Sig Start Date End Date Taking? Authorizing Provider  guaifenesin (ROBITUSSIN) 100 MG/5ML syrup Take 200 mg by mouth 3 (three) times daily as needed for cough.   Yes [provider]  albuterol (PROVENTIL HFA;VENTOLIN HFA) 108 (90 Base) MCG/ACT inhaler Inhale 1-2 puffs into the lungs every 6 (six) hours as needed for wheezing or shortness of breath. Patient not taking: Reported on 04/30/2017 11/04/16   Everlene Farrieransie, William, PA-C  albuterol (PROVENTIL)  (2.5 MG/3ML) 0.083% nebulizer solution Take 3 mLs (2.5 mg total) by nebulization every 6 (six) hours as needed for wheezing or shortness of breath. Patient not taking: Reported on 04/30/2017 11/04/16   Everlene Farrieransie, William, PA-C  predniSONE (DELTASONE) 10 MG tablet Take 4 tablets (40 mg total) by mouth daily. 04/30/17   Tilden Fossaees, Jourdan Maldonado, MD    Family History Family History  Problem Relation Age of Onset  . Asthma Mother     Social History Social History  Substance Use Topics  . Smoking status: Current Every Day Smoker    Types: Cigars  . Smokeless tobacco: Never Used  . Alcohol use No     Allergies   Peanut-containing drug products   Review of Systems Review of Systems  Respiratory: Positive for cough.   Cardiovascular: Positive for chest pain.  All other systems reviewed and are negative.    Physical Exam Updated Vital Signs BP 104/72 (BP Location: Left Arm)   Pulse 99   Temp 98.4 F (36.9 C) (Oral)   Resp 10   Ht 5\' 3"  (1.6 m)   Wt 45.4 kg (100 lb)   LMP 04/30/2017   SpO2 98%   BMI 17.71 kg/m   Physical Exam  Constitutional: She is oriented to person, place, and time. She appears well-developed and well-nourished.  HENT:  Head: Normocephalic and atraumatic.  Cardiovascular: Normal rate and regular rhythm.  No murmur heard. Pulmonary/Chest: Effort normal. No respiratory distress.  End expiratory wheezes bilaterally  Abdominal: Soft. There is no tenderness. There is no rebound and no guarding.  Musculoskeletal: She exhibits no edema or tenderness.       Arms: Neurological: She is alert and oriented to person, place, and time.  Skin: Skin is warm and dry.  Psychiatric: She has a normal mood and affect. Her behavior is normal.  Nursing note and vitals reviewed.    ED Treatments / Results  Labs (all labs ordered are listed, but only abnormal results are displayed) Labs Reviewed  COMPREHENSIVE METABOLIC PANEL - Abnormal; Notable for the following:       Result  Value   ALT 11 (*)    All other components within normal limits  CBC WITH DIFFERENTIAL/PLATELET - Abnormal; Notable for the following:    MCV 72.2 (*)    MCH 25.2 (*)    All other components within normal limits  URINALYSIS, ROUTINE W REFLEX MICROSCOPIC - Abnormal; Notable for the following:    Hgb urine dipstick SMALL (*)    Leukocytes, UA SMALL (*)    Bacteria, UA RARE (*)    Squamous Epithelial / LPF 0-5 (*)    All other components within normal limits  URINE CULTURE  POC URINE PREG, ED    EKG  EKG Interpretation None       Radiology Dg Chest 2 View  Result Date: 04/30/2017 CLINICAL DATA:  Cough, congestion, chest pain EXAM: CHEST  2 VIEW COMPARISON:  07/29/2014 FINDINGS: Heart and mediastinal contours are within normal limits. No focal opacities or effusions. No acute bony abnormality. IMPRESSION: No active cardiopulmonary disease. Electronically Signed   By: Charlett Nose M.D.   On: 04/30/2017 09:35    Procedures Procedures (including critical care time)  Medications Ordered in ED Medications  predniSONE (DELTASONE) tablet 40 mg (not administered)  albuterol (PROVENTIL HFA;VENTOLIN HFA) 108 (90 Base) MCG/ACT inhaler 2 puff (not administered)  ipratropium-albuterol (DUONEB) 0.5-2.5 (3) MG/3ML nebulizer solution 3 mL (3 mLs Nebulization Given 04/30/17 6578)     Initial Impression / Assessment and Plan / ED Course  I have reviewed the triage vital signs and the nursing notes.  Pertinent labs & imaging results that were available during my care of the patient were reviewed by me and considered in my medical decision making (see chart for details).     Patient here for evaluation of left side pain, cough. She does have wheezing on initial evaluation as well as chest wall tenderness.  Following treatment with albuterol her wheezing has resolved and her shortness of breath has improved. No evidence of pneumonia. Presentation is not consistent with PE, perk negative. In terms  of her side pain, this is not consistent with renal colic or UTI. Discussed with patient homecare for asthma exacerbation with musculoskeletal pain. Discussed outpatient follow up and return precautions.  Final Clinical Impressions(s) / ED Diagnoses   Final diagnoses:  Mild intermittent asthma with exacerbation  Left sided chest pain    New Prescriptions New Prescriptions   PREDNISONE (DELTASONE) 10 MG TABLET    Take 4 tablets (40 mg total) by mouth daily.     Tilden Fossa, MD 04/30/17 [phone]

## 2017-04-30 NOTE — ED Notes (Signed)
Patient transported to X-ray 

## 2017-04-30 NOTE — Discharge Instructions (Signed)
You can use the albuterol inhaler, 2 puffs every 4-6 hours as needed for cough and wheezing. Get rechecked immediately if you develop any new or concerning symptoms.

## 2017-05-01 LAB — URINE CULTURE: Culture: NO GROWTH

## 2017-05-06 ENCOUNTER — Emergency Department (HOSPITAL_COMMUNITY): Payer: Self-pay

## 2017-05-06 ENCOUNTER — Encounter (HOSPITAL_COMMUNITY): Payer: Self-pay

## 2017-05-06 ENCOUNTER — Emergency Department (HOSPITAL_COMMUNITY)
Admission: EM | Admit: 2017-05-06 | Discharge: 2017-05-06 | Disposition: A | Discharge status: Home / Self Care | Payer: Self-pay | Attending: Emergency Medicine | Admitting: Emergency Medicine

## 2017-05-06 DIAGNOSIS — F17290 Nicotine dependence, other tobacco product, uncomplicated: Secondary | ICD-10-CM | POA: Insufficient documentation

## 2017-05-06 DIAGNOSIS — J45901 Unspecified asthma with (acute) exacerbation: Secondary | ICD-10-CM | POA: Insufficient documentation

## 2017-05-06 DIAGNOSIS — Z91010 Allergy to peanuts: Secondary | ICD-10-CM | POA: Insufficient documentation

## 2017-05-06 MED ORDER — DEXAMETHASONE SODIUM PHOSPHATE 10 MG/ML IJ SOLN
10.0000 mg | Freq: Once | INTRAMUSCULAR | Status: AC
  Administered 2017-05-06: 10 mg via INTRAMUSCULAR
  Filled 2017-05-06: qty 1

## 2017-05-06 NOTE — Discharge Instructions (Signed)
Please read attached information regarding your condition. Continue home medications as previously prescribed. Return to ED for worsening chest pain, increased shortness of breath, increased wheezing, fevers.

## 2017-05-06 NOTE — ED Provider Notes (Signed)
[facility]-EMERGENCY DEPT Provider Note   CSN: [id number] Arrival date & time: 05/06/17  1032     History   Chief Complaint Chief Complaint  Patient presents with  . cough/congestion    HPI Carol Ramos is a 21 y.o. female.  HPI Patient, with a past medical history of asthma and allergies, presents to ED for evaluation of ongoing cough and congestion for the past week. She states that this feels similar to her prior asthma exacerbations. She was seen here 6 days ago when symptoms began and was unable to get her nebulizer refills and steroids filled due to financial constraints. She states that she does have the money right now. She has tried her inhaler with some improvement in symptoms. She reports subjective fever a few days ago. She denies any hemoptysis, chest pain, nausea, vomiting, sick contacts or palpitations.  Past Medical History:  Diagnosis Date  . Allergy   . Asthma     Patient Active Problem List   Diagnosis Date Noted  . Pyelonephritis 04/25/2016  . Sepsis (HCC) 04/25/2016  . Hyponatremia 04/25/2016  . Hypokalemia 04/25/2016  . Mild intermittent asthma 04/25/2016    Past Surgical History:  Procedure Laterality Date  . KNEE SURGERY      OB History    No data available       Home Medications    Prior to Admission medications   Medication Sig Start Date End Date Taking? Authorizing Provider  albuterol (PROVENTIL HFA;VENTOLIN HFA) 108 (90 Base) MCG/ACT inhaler Inhale 1-2 puffs into the lungs every 6 (six) hours as needed for wheezing or shortness of breath. Patient not taking: Reported on 04/30/2017 11/04/16   Everlene Farrieransie, William, PA-C  albuterol (PROVENTIL) (2.5 MG/3ML) 0.083% nebulizer solution Take 3 mLs (2.5 mg total) by nebulization every 6 (six) hours as needed for wheezing or shortness of breath. Patient not taking: Reported on 04/30/2017 11/04/16   Everlene Farrieransie, William, PA-C  guaifenesin (ROBITUSSIN) 100 MG/5ML syrup Take 200 mg by mouth 3 (three) times daily as  needed for cough.    [provider]  predniSONE (DELTASONE) 10 MG tablet Take 4 tablets (40 mg total) by mouth daily. 04/30/17   Tilden Fossaees, Elizabeth, MD    Family History Family History  Problem Relation Age of Onset  . Asthma Mother     Social History Social History  Substance Use Topics  . Smoking status: Current Every Day Smoker    Types: Cigars  . Smokeless tobacco: Never Used  . Alcohol use No     Allergies   Peanut-containing drug products   Review of Systems Review of Systems  Constitutional: Positive for chills. Negative for appetite change and fever.  HENT: Positive for congestion. Negative for ear discharge, ear pain, facial swelling, rhinorrhea and sore throat.   Respiratory: Positive for cough and wheezing. Negative for shortness of breath.   Cardiovascular: Negative for chest pain, palpitations and leg swelling.  Gastrointestinal: Negative for nausea and vomiting.  Skin: Negative for rash.     Physical Exam Updated Vital Signs BP 108/62   Pulse 90   Temp 98.4 F (36.9 C)   Resp 18   LMP 04/30/2017   SpO2 99%   Physical Exam  Constitutional: She appears well-developed and well-nourished. No distress.  HENT:  Head: Normocephalic and atraumatic.  Right Ear: Tympanic membrane normal.  Left Ear: Tympanic membrane normal.  Nose: Mucosal edema and rhinorrhea present.  Mouth/Throat: Uvula is midline and oropharynx is clear and moist.  Eyes: Conjunctivae and  EOM are normal. No scleral icterus.  Neck: Normal range of motion.  Cardiovascular: Normal rate, regular rhythm and normal heart sounds.   Pulmonary/Chest: Effort normal. No respiratory distress. She has wheezes.  Diffuse wheezing throughout bilateral lung fields.  Neurological: She is alert.  Skin: No rash noted. She is not diaphoretic.  Psychiatric: She has a normal mood and affect.  Nursing note and vitals reviewed.    ED Treatments / Results  Labs (all labs ordered are listed, but  only abnormal results are displayed) Labs Reviewed - No data to display  EKG  EKG Interpretation None       Radiology Dg Chest 2 View  Result Date: 05/06/2017 CLINICAL DATA:  Cough, fever, mid behind EXAM: CHEST  2 VIEW COMPARISON:  04/30/2017 FINDINGS: The heart size and mediastinal contours are within normal limits. Both lungs are clear. The visualized skeletal structures are unremarkable. IMPRESSION: No active cardiopulmonary disease. Electronically Signed   By: Elige Ko   On: 05/06/2017 12:52    Procedures Procedures (including critical care time)  Medications Ordered in ED Medications  dexamethasone (DECADRON) injection 10 mg (not administered)     Initial Impression / Assessment and Plan / ED Course  I have reviewed the triage vital signs and the nursing notes.  Pertinent labs & imaging results that were available during my care of the patient were reviewed by me and considered in my medical decision making (see chart for details).     Patient presents to ED for evaluation of cough and congestion for the past week. She was seen here 6 days ago after symptoms began what was unable to get her steroid prescription filled. States that she has a history of asthma and this feels similar to previous flareups. She has diffuse wheezing in bilateral lung fields but is afebrile here in the ED. She declined breathing treatment at this time. Chest x-ray returned as negative for pneumonia. Patient states that she is able to get her steroids filled because she now has money. I offered her a Decadron injection here and she agreed to have this. She states that she has her prescriptions and inhaler at home. Patient appears stable for discharge at this time. Strict return precautions given.  Final Clinical Impressions(s) / ED Diagnoses   Final diagnoses:  Mild asthma with exacerbation, unspecified whether persistent    New Prescriptions New Prescriptions   No medications on file      Dietrich Pates, PA-C 05/06/17 1301    Loren Racer, MD 05/07/17 [phone]

## 2017-05-06 NOTE — ED Notes (Addendum)
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 Pt sts will wait till 12. And re-check with RN if she is unable to stay.

## 2017-05-06 NOTE — ED Triage Notes (Signed)
Patient complains of ongoing cough and congestion. Seen for same on 9/7 and using inhaler but hasn't gotten  the steroids.

## 2017-10-11 ENCOUNTER — Encounter (HOSPITAL_COMMUNITY): Payer: Self-pay

## 2017-10-11 DIAGNOSIS — F17210 Nicotine dependence, cigarettes, uncomplicated: Secondary | ICD-10-CM | POA: Diagnosis not present

## 2017-10-11 DIAGNOSIS — M5441 Lumbago with sciatica, right side: Secondary | ICD-10-CM | POA: Insufficient documentation

## 2017-10-11 DIAGNOSIS — M5442 Lumbago with sciatica, left side: Secondary | ICD-10-CM | POA: Insufficient documentation

## 2017-10-11 DIAGNOSIS — R300 Dysuria: Secondary | ICD-10-CM | POA: Diagnosis not present

## 2017-10-11 DIAGNOSIS — M545 Low back pain: Secondary | ICD-10-CM | POA: Diagnosis present
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
## 2017-10-12 ENCOUNTER — Emergency Department (HOSPITAL_COMMUNITY)
Admission: EM | Admit: 2017-10-12 | Discharge: 2017-10-12 | Disposition: A | Discharge status: Home / Self Care | Payer: No Typology Code available for payment source | Attending: Emergency Medicine | Admitting: Emergency Medicine

## 2017-10-12 ENCOUNTER — Emergency Department (HOSPITAL_COMMUNITY): Payer: No Typology Code available for payment source

## 2017-10-12 DIAGNOSIS — M5442 Lumbago with sciatica, left side: Secondary | ICD-10-CM

## 2017-10-12 DIAGNOSIS — M5441 Lumbago with sciatica, right side: Secondary | ICD-10-CM

## 2017-10-12 DIAGNOSIS — R300 Dysuria: Secondary | ICD-10-CM

## 2017-10-12 LAB — URINALYSIS, ROUTINE W REFLEX MICROSCOPIC
Bilirubin Urine: NEGATIVE
GLUCOSE, UA: NEGATIVE mg/dL
Hgb urine dipstick: NEGATIVE
Ketones, ur: NEGATIVE mg/dL
Nitrite: NEGATIVE
PH: 7 (ref 5.0–8.0)
Protein, ur: NEGATIVE mg/dL
SPECIFIC GRAVITY, URINE: 1.021 (ref 1.005–1.030)

## 2017-10-12 MED ORDER — METHOCARBAMOL 500 MG PO TABS: 500.0000 mg | ORAL_TABLET | Freq: Two times a day (BID) | ORAL | 0 refills | Status: AC

## 2017-10-12 MED ORDER — NAPROXEN 375 MG PO TABS: 375.0000 mg | ORAL_TABLET | Freq: Two times a day (BID) | ORAL | 0 refills | Status: AC

## 2017-10-12 MED ORDER — CEPHALEXIN 500 MG PO CAPS: 500.0000 mg | ORAL_CAPSULE | Freq: Two times a day (BID) | ORAL | 0 refills | Status: AC
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
## 2017-10-12 NOTE — Discharge Instructions (Signed)
Your x-ray was normal.  Will give muscle relaxers and anti-inflammatories.  Urine does show small signs of infection and given your dysuria we will treat you antibiotics.  If you develop any fevers, worsening pain or symptoms not improving the next 3 days return the ED for evaluation or follow with primary care doctor.  Workup has been normal. Please take medications as prescribed and instructed.  SEEK IMMEDIATE MEDICAL ATTENTION IF: New numbness, tingling, weakness, or problem with the use of your arms or legs.  Severe back pain not relieved with medications.  Change in bowel or bladder control.  Urinary retention.  Numbness in your groin.  Increasing pain in any areas of the body (such as chest or abdominal pain).  Shortness of breath, dizziness or fainting.  Nausea (feeling sick to your stomach), vomiting, fever, or sweats.

## 2017-10-12 NOTE — ED Notes (Signed)
Called in lobby x 2 for treatment room no answer
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
